# Patient Record
Sex: Male | Born: 1975 | Race: Black or African American | Hispanic: No | Marital: Married | Smoking: Never smoker
Health system: Southern US, Community
[De-identification: ages and names within clinical notes are randomized; demographics above are authoritative.]

## PROBLEM LIST (undated history)

## (undated) DIAGNOSIS — M1990 Unspecified osteoarthritis, unspecified site: Secondary | ICD-10-CM

## (undated) DIAGNOSIS — S82402A Unspecified fracture of shaft of left fibula, initial encounter for closed fracture: Secondary | ICD-10-CM

## (undated) DIAGNOSIS — S82862A Displaced Maisonneuve's fracture of left leg, initial encounter for closed fracture: Secondary | ICD-10-CM

## (undated) HISTORY — DX: Unspecified fracture of shaft of left fibula, initial encounter for closed fracture: S82.402A

## (undated) HISTORY — PX: NO PAST SURGERIES: SHX2092

---
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
## 2003-04-15 ENCOUNTER — Emergency Department (HOSPITAL_COMMUNITY): Admission: EM | Admit: 2003-04-15 | Discharge: 2003-04-15 | Payer: Self-pay

## 2004-07-31 ENCOUNTER — Ambulatory Visit: Payer: Self-pay | Admitting: Family Medicine

## 2013-03-10 ENCOUNTER — Ambulatory Visit
Admission: RE | Admit: 2013-03-10 | Discharge: 2013-03-10 | Disposition: A | Discharge status: Home / Self Care | Payer: BC Managed Care – PPO | Source: Ambulatory Visit | Attending: Family Medicine | Admitting: Family Medicine

## 2013-03-10 ENCOUNTER — Ambulatory Visit (INDEPENDENT_AMBULATORY_CARE_PROVIDER_SITE_OTHER): Payer: BC Managed Care – PPO | Admitting: Family Medicine

## 2013-03-10 ENCOUNTER — Encounter: Payer: Self-pay | Admitting: Family Medicine

## 2013-03-10 VITALS — BP 110/80 | HR 60 | Temp 98.1°F | Resp 12 | Ht 72.0 in | Wt 201.0 lb

## 2013-03-10 DIAGNOSIS — M545 Low back pain, unspecified: Secondary | ICD-10-CM

## 2013-03-10 MED ORDER — MELOXICAM 15 MG PO TABS: 15.0000 mg | ORAL_TABLET | Freq: Every day | ORAL | Status: DC

## 2013-03-10 NOTE — Progress Notes (Signed)
  Subjective:    Patient ID: Trevor Chavez, male    DOB: 1976-01-09, 37 y.o.   MRN: [id number]  HPI Patient is a very healthy 37 year old African American male who comes in today complaining of back pain. 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  He reports pain and stiffness in the bilateral paraspinal muscles most afternoons. He is extremely stiff in the morning when he wakes up. If he moves around the flexibility improve the pain in it resolves. However by the end of the day the pain returns and is worse. Sometimes at night he will also get some sciatica-like pain radiating down his left leg. He denies any symptoms of cauda equina syndrome. He denies any severe radicular pain. No past medical history on file. No current outpatient prescriptions on file prior to visit.   No current facility-administered medications on file prior to visit.   No Known Allergies History   Social History  . Marital Status: Married    Spouse Name: N/A    Number of Children: N/A  . Years of Education: N/A   Occupational History  . Not on file.   Social History Main Topics  . Smoking status: Never Smoker   . Smokeless tobacco: Not on file  . Alcohol Use: Yes     Comment: Occasional  . Drug Use: No  . Sexual Activity: Not on file   Other Topics Concern  . Not on file   Social History Narrative  . No narrative on file      Review of Systems  All other systems reviewed and are negative.       Objective:   Physical Exam  Vitals reviewed. Constitutional: He is oriented to person, place, and time.  Cardiovascular: Normal rate and regular rhythm.   Pulmonary/Chest: Effort normal and breath sounds normal.  Musculoskeletal:       Lumbar back: He exhibits decreased range of motion, tenderness and spasm. He exhibits no bony tenderness, no swelling, no edema and no deformity.  Neurological: He is alert and oriented to  person, place, and time. He has normal reflexes. He displays normal reflexes. No cranial nerve deficit. He exhibits normal muscle tone. Coordination normal.          Assessment & Plan:  1. Low back pain I believe the patient likely has low back pain due to her chronic myofascial strain. Recommended a lumbar spine x-ray to rule out bony pathology. Recommended that he went back brace at work. Also gave the patient a handout on home physical therapy stretches and exercises to perform for chronic low back pain. Recommended he take meloxicam 15 mg every day to each inflammation in his back. I did discuss the GI side effects this medication and recommended he periodically take breaks to prevent ulcer formation. - meloxicam (MOBIC) 15 MG tablet; Take 1 tablet (15 mg total) by mouth daily.  Dispense: 30 tablet; Refill: 2 - DG Lumbar Spine Complete; Future

## 2013-03-16 ENCOUNTER — Telehealth: Payer: Self-pay | Admitting: Family Medicine

## 2013-03-16 NOTE — Telephone Encounter (Signed)
Wants to know X-ray results. Call [phone]

## 2013-12-04 ENCOUNTER — Ambulatory Visit: Payer: BC Managed Care – PPO | Admitting: Family Medicine

## 2013-12-20 ENCOUNTER — Other Ambulatory Visit: Payer: BC Managed Care – PPO

## 2013-12-20 DIAGNOSIS — Z0000 Encounter for general adult medical examination without abnormal findings: Secondary | ICD-10-CM

## 2013-12-20 LAB — COMPREHENSIVE METABOLIC PANEL
ALT: 27 U/L (ref 0–53)
AST: 32 U/L (ref 0–37)
Albumin: 4.1 g/dL (ref 3.5–5.2)
Alkaline Phosphatase: 48 U/L (ref 39–117)
BILIRUBIN TOTAL: 0.9 mg/dL (ref 0.2–1.2)
BUN: 12 mg/dL (ref 6–23)
CALCIUM: 9 mg/dL (ref 8.4–10.5)
CHLORIDE: 104 meq/L (ref 96–112)
CO2: 26 meq/L (ref 19–32)
Creat: 1.26 mg/dL (ref 0.50–1.35)
GLUCOSE: 87 mg/dL (ref 70–99)
Potassium: 4 mEq/L (ref 3.5–5.3)
Sodium: 139 mEq/L (ref 135–145)
Total Protein: 6.5 g/dL (ref 6.0–8.3)

## 2013-12-20 LAB — LIPID PANEL
CHOLESTEROL: 173 mg/dL (ref 0–200)
HDL: 65 mg/dL (ref >39)
LDL Cholesterol: 86 mg/dL (ref 0–99)
Total CHOL/HDL Ratio: 2.7 Ratio
Triglycerides: 111 mg/dL (ref <150)
VLDL: 22 mg/dL (ref 0–40)

## 2013-12-29 ENCOUNTER — Encounter: Payer: BC Managed Care – PPO | Admitting: Family Medicine

## 2014-01-08 ENCOUNTER — Ambulatory Visit (INDEPENDENT_AMBULATORY_CARE_PROVIDER_SITE_OTHER): Payer: BC Managed Care – PPO | Admitting: Family Medicine

## 2014-01-08 ENCOUNTER — Encounter: Payer: Self-pay | Admitting: Family Medicine

## 2014-01-08 VITALS — BP 106/74 | HR 68 | Temp 97.5°F | Resp 14 | Ht 72.0 in | Wt 220.0 lb

## 2014-01-08 DIAGNOSIS — M5432 Sciatica, left side: Secondary | ICD-10-CM

## 2014-01-08 DIAGNOSIS — Z0000 Encounter for general adult medical examination without abnormal findings: Secondary | ICD-10-CM

## 2014-01-08 DIAGNOSIS — M5430 Sciatica, unspecified side: Secondary | ICD-10-CM

## 2014-01-08 MED ORDER — PREDNISONE 20 MG PO TABS: ORAL_TABLET | ORAL | Status: DC

## 2014-01-08 NOTE — Progress Notes (Signed)
Subjective:    Patient ID: Trevor Chavez, male    DOB: 1975-09-11, 38 y.o.   MRN: [id number]  HPI  Patient is here for CPE.  Overall he is doing well.  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  Ever since that time, he had a deep burning pain radiating from his lower back into his left gluteus.  He denies any weakness or numbness in his legs. The pain is worse with prolonged standing.  His most recent lab work is listed: Lab on 12/20/2013  Component Date Value Ref Range Status  . Sodium 12/20/2013 139  135 - 145 mEq/L Final  . Potassium 12/20/2013 4.0  3.5 - 5.3 mEq/L Final  . Chloride 12/20/2013 104  96 - 112 mEq/L Final  . CO2 12/20/2013 26  19 - 32 mEq/L Final  . Glucose, Bld 12/20/2013 87  70 - 99 mg/dL Final  . BUN 40/98/1191 12  6 - 23 mg/dL Final  . Creat 47/82/9562 1.26  0.50 - 1.35 mg/dL Final  . Total Bilirubin 12/20/2013 0.9  0.2 - 1.2 mg/dL Final  . Alkaline Phosphatase 12/20/2013 48  39 - 117 U/L Final  . AST 12/20/2013 32  0 - 37 U/L Final  . ALT 12/20/2013 27  0 - 53 U/L Final  . Total Protein 12/20/2013 6.5  6.0 - 8.3 g/dL Final  . Albumin 13/01/6577 4.1  3.5 - 5.2 g/dL Final  . Calcium 46/96/2952 9.0  8.4 - 10.5 mg/dL Final  . Cholesterol 84/13/2440 173  0 - 200 mg/dL Final   Comment: ATP III Classification:                                < 200        mg/dL        Desirable                               200 - 239     mg/dL        Borderline High                               >= 240        mg/dL        High                             . Triglycerides 12/20/2013 111  <150 mg/dL Final  . HDL 04/04/2535 65  >39 mg/dL Final  . Total CHOL/HDL Ratio 12/20/2013 2.7   Final  . VLDL 12/20/2013 22  0 - 40 mg/dL Final  . LDL Cholesterol 12/20/2013 86  0 - 99 mg/dL Final   Comment:                            Total Cholesterol/HDL Ratio:CHD Risk                                                 Coronary Heart Disease Risk Table  Men       Women                                   1/2 Average Risk              3.4        3.3                                       Average Risk              5.0        4.4                                    2X Average Risk              9.6        7.1                                    3X Average Risk             23.4       11.0                          Use the calculated Patient Ratio above and the CHD Risk table                           to determine the patient's CHD Risk.                          ATP III Classification (LDL):                                < 100        mg/dL         Optimal                               100 - 129     mg/dL         Near or Above Optimal                               130 - 159     mg/dL         Borderline High                               160 - 189     mg/dL         High                                > 190        mg/dL         Very High  No past medical history on file. No past surgical history on file. No current outpatient prescriptions on file prior to visit.   No current facility-administered medications on file prior to visit.   No Known Allergies History   Social History  . Marital Status: Married    Spouse Name: N/A    Number of Children: N/A  . Years of Education: N/A   Occupational History  . Not on file.   Social History Main Topics  . Smoking status: Never Smoker   . Smokeless tobacco: Not on file  . Alcohol Use: Yes     Comment: Occasional  . Drug Use: No  . Sexual Activity: Not on file   Other Topics Concern  . Not on file   Social History Narrative  . No narrative on file   Family History  Problem Relation Age of Onset  . Cancer Mother     lung  . Hypertension Father   . Mental illness Brother      Review of Systems  All other systems reviewed and are negative.      Objective:   Physical Exam  Vitals reviewed. Constitutional: He is  oriented to person, place, and time. He appears well-developed and well-nourished. No distress.  HENT:  Head: Normocephalic and atraumatic.  Right Ear: External ear normal.  Left Ear: External ear normal.  Nose: Nose normal.  Mouth/Throat: Oropharynx is clear and moist. No oropharyngeal exudate.  Eyes: Conjunctivae and EOM are normal. Pupils are equal, round, and reactive to light. Right eye exhibits no discharge. Left eye exhibits no discharge. No scleral icterus.  Neck: Normal range of motion. Neck supple. No JVD present. No tracheal deviation present. No thyromegaly present.  Cardiovascular: Normal rate, regular rhythm, normal heart sounds and intact distal pulses.  Exam reveals no gallop and no friction rub.   No murmur heard. Pulmonary/Chest: Effort normal and breath sounds normal. No stridor. No respiratory distress. He has no wheezes. He has no rales. He exhibits no tenderness.  Abdominal: Soft. Bowel sounds are normal. He exhibits no distension and no mass. There is no tenderness. There is no rebound and no guarding.  Genitourinary: Penis normal.  Musculoskeletal: Normal range of motion. He exhibits no edema and no tenderness.  Lymphadenopathy:    He has no cervical adenopathy.  Neurological: He is alert and oriented to person, place, and time. He has normal reflexes. He displays normal reflexes. No cranial nerve deficit. He exhibits normal muscle tone. Coordination normal.  Skin: Skin is warm. No rash noted. He is not diaphoretic. No erythema. No pallor.  Psychiatric: He has a normal mood and affect. His behavior is normal. Judgment and thought content normal.          Assessment & Plan:  1. Sciatica associated with disorder of lumbar spine, left Symptoms are consistent with sciatica. I give the patient prednisone taper pack. If symptoms persist greater than another 3 or 4 weeks I recommend an MRI - predniSONE (DELTASONE) 20 MG tablet; 3 tabs poqday 1-2, 2 tabs poqday 3-4, 1 tab  poqday 5-6  Dispense: 12 tablet; Refill: 0  2. Routine general medical examination at a health care facility Physical exam is completely normal. Lab work is excellent. Regular anticipatory guidance is provided. Follow up in one month of back pain is no better.

## 2015-03-21 ENCOUNTER — Encounter: Payer: Self-pay | Admitting: Family Medicine

## 2015-03-21 ENCOUNTER — Ambulatory Visit (INDEPENDENT_AMBULATORY_CARE_PROVIDER_SITE_OTHER): Payer: BLUE CROSS/BLUE SHIELD | Admitting: Family Medicine

## 2015-03-21 VITALS — BP 122/62 | HR 72 | Temp 98.2°F | Resp 14 | Ht 72.0 in | Wt 218.0 lb

## 2015-03-21 DIAGNOSIS — Z0000 Encounter for general adult medical examination without abnormal findings: Secondary | ICD-10-CM | POA: Diagnosis not present

## 2015-03-21 LAB — COMPLETE METABOLIC PANEL WITH GFR
ALT: 22 U/L (ref 9–46)
AST: 29 U/L (ref 10–40)
Albumin: 4 g/dL (ref 3.6–5.1)
Alkaline Phosphatase: 49 U/L (ref 40–115)
BUN: 11 mg/dL (ref 7–25)
CHLORIDE: 103 mmol/L (ref 98–110)
CO2: 28 mmol/L (ref 20–31)
Calcium: 9.1 mg/dL (ref 8.6–10.3)
Creat: 1.18 mg/dL (ref 0.60–1.35)
GFR, EST NON AFRICAN AMERICAN: 77 mL/min (ref >=60)
GFR, Est African American: 89 mL/min (ref >=60)
GLUCOSE: 81 mg/dL (ref 70–99)
POTASSIUM: 4 mmol/L (ref 3.5–5.3)
SODIUM: 139 mmol/L (ref 135–146)
Total Bilirubin: 0.6 mg/dL (ref 0.2–1.2)
Total Protein: 6.5 g/dL (ref 6.1–8.1)

## 2015-03-21 LAB — CBC WITH DIFFERENTIAL/PLATELET
Basophils Absolute: 0.1 10*3/uL (ref 0.0–0.1)
Basophils Relative: 1 % (ref 0–1)
Eosinophils Absolute: 0.2 10*3/uL (ref 0.0–0.7)
Eosinophils Relative: 4 % (ref 0–5)
HEMATOCRIT: 45.3 % (ref 39.0–52.0)
HEMOGLOBIN: 15.4 g/dL (ref 13.0–17.0)
LYMPHS PCT: 39 % (ref 12–46)
Lymphs Abs: 2 10*3/uL (ref 0.7–4.0)
MCH: 30 pg (ref 26.0–34.0)
MCHC: 34 g/dL (ref 30.0–36.0)
MCV: 88.1 fL (ref 78.0–100.0)
MONO ABS: 0.5 10*3/uL (ref 0.1–1.0)
MONOS PCT: 10 % (ref 3–12)
MPV: 9.9 fL (ref 8.6–12.4)
NEUTROS PCT: 46 % (ref 43–77)
Neutro Abs: 2.3 10*3/uL (ref 1.7–7.7)
Platelets: 238 10*3/uL (ref 150–400)
RBC: 5.14 MIL/uL (ref 4.22–5.81)
RDW: 13 % (ref 11.5–15.5)
WBC: 5 10*3/uL (ref 4.0–10.5)

## 2015-03-21 LAB — LIPID PANEL
CHOL/HDL RATIO: 2.8 ratio (ref <=5.0)
Cholesterol: 152 mg/dL (ref 125–200)
HDL: 54 mg/dL (ref >=40)
LDL CALC: 78 mg/dL (ref <130)
Triglycerides: 100 mg/dL (ref <150)
VLDL: 20 mg/dL (ref <30)

## 2015-03-21 NOTE — Progress Notes (Signed)
Subjective:    Patient ID: Trevor Chavez, male    DOB: 03/18/1976, 39 y.o.   MRN: [id number]  HPI Patient is a very pleasant 39 year old African-American male here today for complete physical exam. He is extremely healthy. He does not smoke. He does not drink. He has no major health problems. He does not take any medication on a regular basis. He is due today for a flu shot as well as a tetanus shot. We discussed both of these and he politely declined both. Recently his business closed and he is currently looking for work. He works in Landscape architectindustrial maintenance. Otherwise he is been doing well. He has 2 children, a daughter who is a teenager and a son who is 40 years old. His son is a Print production plannerbaseball player.  He denies any family history of colon cancer, prostate cancer, or testicular cancer. He is married to [place]. No past medical history on file. No past surgical history on file. No current outpatient prescriptions on file prior to visit.   No current facility-administered medications on file prior to visit.   No Known Allergies Social History   Social History  . Marital Status: Married    Spouse Name: N/A  . Number of Children: N/A  . Years of Education: N/A   Occupational History  . Not on file.   Social History Main Topics  . Smoking status: Never Smoker   . Smokeless tobacco: Never Used  . Alcohol Use: 0.0 oz/week    0 Standard drinks or equivalent per week     Comment: Occasional  . Drug Use: No  . Sexual Activity: Yes   Other Topics Concern  . Not on file   Social History Narrative   Family History  Problem Relation Age of Onset  . Cancer Mother     lung  . Hypertension Father   . Mental illness Brother       Review of Systems  All other systems reviewed and are negative.      Objective:   Physical Exam  Constitutional: He is oriented to person, place, and time. He appears well-developed and well-nourished. No distress.  HENT:  Head: Normocephalic and  atraumatic.  Right Ear: External ear normal.  Left Ear: External ear normal.  Nose: Nose normal.  Mouth/Throat: Oropharynx is clear and moist. No oropharyngeal exudate.  Eyes: Conjunctivae and EOM are normal. Pupils are equal, round, and reactive to light. Right eye exhibits no discharge. Left eye exhibits no discharge. No scleral icterus.  Neck: Normal range of motion. Neck supple. No JVD present. No tracheal deviation present. No thyromegaly present.  Cardiovascular: Normal rate, regular rhythm, normal heart sounds and intact distal pulses.  Exam reveals no gallop and no friction rub.   No murmur heard. Pulmonary/Chest: Effort normal and breath sounds normal. No stridor. No respiratory distress. He has no wheezes. He has no rales. He exhibits no tenderness.  Abdominal: Soft. Bowel sounds are normal. He exhibits no distension and no mass. There is no tenderness. There is no rebound and no guarding. Hernia confirmed negative in the right inguinal area and confirmed negative in the left inguinal area.  Genitourinary: Testes normal and penis normal. Right testis shows no mass. Left testis shows no mass.  Musculoskeletal: Normal range of motion. He exhibits no edema.  Lymphadenopathy:    He has no cervical adenopathy.       Right: No inguinal adenopathy present.       Left: No inguinal adenopathy present.  Neurological: He is alert and oriented to person, place, and time. He has normal reflexes. He displays normal reflexes. No cranial nerve deficit. He exhibits normal muscle tone. Coordination normal.  Skin: Skin is warm. No rash noted. He is not diaphoretic. No erythema. No pallor.  Psychiatric: He has a normal mood and affect. His behavior is normal. Judgment and thought content normal.  Vitals reviewed.         Assessment & Plan:  Routine general medical examination at a health care facility - Plan: CBC with Differential/Platelet, COMPLETE METABOLIC PANEL WITH GFR, Lipid  panel  Patient's physical exam is completely normal. He declines his immunizations today. I will check a CBC, CMP, fasting lipid panel.  Otherwise regular anticipatory guidance is provided. Blood pressure is excellent. Recheck in one year or as needed.

## 2015-03-22 ENCOUNTER — Encounter: Payer: Self-pay | Admitting: Family Medicine

## 2015-06-09 HISTORY — PX: VASECTOMY: SHX75

## 2017-06-25 ENCOUNTER — Ambulatory Visit: Payer: BLUE CROSS/BLUE SHIELD | Admitting: Family Medicine

## 2017-06-25 ENCOUNTER — Ambulatory Visit
Admission: RE | Admit: 2017-06-25 | Discharge: 2017-06-25 | Disposition: A | Discharge status: Home / Self Care | Payer: Self-pay | Source: Ambulatory Visit | Attending: Family Medicine | Admitting: Family Medicine

## 2017-06-25 ENCOUNTER — Encounter: Payer: Self-pay | Admitting: Family Medicine

## 2017-06-25 VITALS — BP 110/68 | HR 64 | Temp 98.3°F | Resp 14 | Ht 72.0 in | Wt 213.0 lb

## 2017-06-25 DIAGNOSIS — M79641 Pain in right hand: Secondary | ICD-10-CM

## 2017-06-25 DIAGNOSIS — S66811A Strain of other specified muscles, fascia and tendons at wrist and hand level, right hand, initial encounter: Secondary | ICD-10-CM

## 2017-06-25 MED ORDER — DICLOFENAC SODIUM 75 MG PO TBEC: 75.0000 mg | DELAYED_RELEASE_TABLET | Freq: Two times a day (BID) | ORAL | 0 refills | Status: DC

## 2017-06-25 NOTE — Progress Notes (Signed)
   Subjective:    Patient ID: Trevor Chavez, male    DOB: 08/11/1975, 42 y.o.   MRN: [id number]  HPI Patient is a very pleasant 42 year old African-American male who presents with 2 weeks of pain in his right first MCP joint. There is also some mild swelling around the MCP joint. He has pain and stiffness with thumb apposition when he tries to touch the hypothenar eminence.  He has tenderness to palpation around the MCP joint. He also has tenderness on the dorsal surface of the MCP joint with Finkelstein's maneuver. There is no pain with thumb abduction however. He has normal range of motion in all his other PIP and DIP joints. There are no palpable nodules or erythema or warmth in any of his joints. He denies any injury. No past medical history on file.  No current outpatient medications on file prior to visit.   No current facility-administered medications on file prior to visit.    No Known Allergies Social History   Socioeconomic History  . Marital status: Married    Spouse name: Not on file  . Number of children: Not on file  . Years of education: Not on file  . Highest education level: Not on file  Social Needs  . Financial resource strain: Not on file  . Food insecurity - worry: Not on file  . Food insecurity - inability: Not on file  . Transportation needs - medical: Not on file  . Transportation needs - non-medical: Not on file  Occupational History  . Not on file  Tobacco Use  . Smoking status: Never Smoker  . Smokeless tobacco: Never Used  Substance and Sexual Activity  . Alcohol use: Yes    Alcohol/week: 0.0 oz    Comment: Occasional  . Drug use: No  . Sexual activity: Yes  Other Topics Concern  . Not on file  Social History Narrative  . Not on file      Review of Systems  All other systems reviewed and are negative.      Objective:   Physical Exam  Constitutional: He appears well-developed and well-nourished.  Cardiovascular: Normal rate, regular  rhythm and normal heart sounds.  Pulmonary/Chest: Effort normal and breath sounds normal.  Musculoskeletal:       Right hand: He exhibits decreased range of motion, tenderness, bony tenderness and swelling. He exhibits normal capillary refill and no deformity. Normal sensation noted. Decreased sensation is not present in the medial distribution. Decreased strength noted. He exhibits thumb/finger opposition.       Hands: Nursing note and vitals reviewed.         Assessment & Plan:  Right hand pain - Plan: DG Hand Complete Right  Strain of metacarpophalangeal joint of right hand, initial encounter  I believe the patient has either strain the first MCP joint or has early arthritis in the first MCP joint due to his occupation. I recommended wearing a thumb spica splint for the next 2 weeks to help rest the joint. Also recommended diclofenac 75 mg by mouth twice a day. Ice as needed. Obtain x-ray of the right hand to evaluate further.

## 2018-07-11 ENCOUNTER — Ambulatory Visit (INDEPENDENT_AMBULATORY_CARE_PROVIDER_SITE_OTHER): Payer: BLUE CROSS/BLUE SHIELD | Admitting: Family Medicine

## 2018-07-11 ENCOUNTER — Encounter: Payer: Self-pay | Admitting: Family Medicine

## 2018-07-11 VITALS — BP 126/80 | HR 64 | Temp 98.0°F | Resp 16 | Ht 72.0 in | Wt 225.0 lb

## 2018-07-11 DIAGNOSIS — R195 Other fecal abnormalities: Secondary | ICD-10-CM

## 2018-07-11 DIAGNOSIS — Z23 Encounter for immunization: Secondary | ICD-10-CM

## 2018-07-11 DIAGNOSIS — Z0000 Encounter for general adult medical examination without abnormal findings: Secondary | ICD-10-CM | POA: Diagnosis not present

## 2018-07-11 NOTE — Addendum Note (Signed)
Addended by: Legrand Rams B on: 07/11/2018 02:29 PM   Modules accepted: Orders

## 2018-07-11 NOTE — Progress Notes (Signed)
Subjective:    Patient ID: Trevor Chavez, male    DOB: 02/25/1976, [age] y.o.   MRN: [id number]  HPI Patient is a very pleasant 43 year old African-American male here today for complete physical exam. He is extremely healthy. He does not smoke. He does not drink. He has no major health problems. He does not take any medication on a regular basis.  He denies any family history of colon cancer, prostate cancer, or testicular cancer. He is married to [name].  Patient states that over the last 6 to 8 months, he has noticed that once or twice a week his stool will be flat when it comes out.  The other times he has a bowel movement it will be completely normal.  He denies any melena or hematochezia.  He denies any abdominal pain although he does occasionally get cramps if he eats a lot of fruit.  He did have a diagnosis of IBS when he was younger.  He also states that he suffers from occasional constipation.  He has no significant abdominal pain or perirectal pain with defecation No past medical history on file.  Current Outpatient Medications on File Prior to Visit  Medication Sig Dispense Refill  . diclofenac (VOLTAREN) 75 MG EC tablet Take 1 tablet (75 mg total) by mouth 2 (two) times daily. 30 tablet 0   No current facility-administered medications on file prior to visit.    No Known Allergies Social History   Socioeconomic History  . Marital status: Married    Spouse name: Not on file  . Number of children: Not on file  . Years of education: Not on file  . Highest education level: Not on file  Occupational History  . Not on file  Social Needs  . Financial resource strain: Not on file  . Food insecurity:    Worry: Not on file    Inability: Not on file  . Transportation needs:    Medical: Not on file    Non-medical: Not on file  Tobacco Use  . Smoking status: Never Smoker  . Smokeless tobacco: Never Used  Substance and Sexual Activity  . Alcohol use: Yes    Alcohol/week: 0.0  standard drinks    Comment: Occasional  . Drug use: No  . Sexual activity: Yes  Lifestyle  . Physical activity:    Days per week: Not on file    Minutes per session: Not on file  . Stress: Not on file  Relationships  . Social connections:    Talks on phone: Not on file    Gets together: Not on file    Attends religious service: Not on file    Active member of club or organization: Not on file    Attends meetings of clubs or organizations: Not on file    Relationship status: Not on file  . Intimate partner violence:    Fear of current or ex partner: Not on file    Emotionally abused: Not on file    Physically abused: Not on file    Forced sexual activity: Not on file  Other Topics Concern  . Not on file  Social History Narrative  . Not on file   Family History  Problem Relation Age of Onset  . Cancer Mother        lung  . Hypertension Father   . Mental illness Brother       Review of Systems  All other systems reviewed and are negative.  Objective:   Physical Exam  Constitutional: He is oriented to person, place, and time. He appears well-developed and well-nourished. No distress.  HENT:  Head: Normocephalic and atraumatic.  Right Ear: External ear normal.  Left Ear: External ear normal.  Nose: Nose normal.  Mouth/Throat: Oropharynx is clear and moist. No oropharyngeal exudate.  Eyes: Pupils are equal, round, and reactive to light. Conjunctivae and EOM are normal. Right eye exhibits no discharge. Left eye exhibits no discharge. No scleral icterus.  Neck: Normal range of motion. Neck supple. No JVD present. No tracheal deviation present. No thyromegaly present.  Cardiovascular: Normal rate, regular rhythm, normal heart sounds and intact distal pulses. Exam reveals no gallop and no friction rub.  No murmur heard. Pulmonary/Chest: Effort normal and breath sounds normal. No stridor. No respiratory distress. He has no wheezes. He has no rales. He exhibits no  tenderness.  Abdominal: Soft. Bowel sounds are normal. He exhibits no distension and no mass. There is no abdominal tenderness. There is no rebound and no guarding. Hernia confirmed negative in the right inguinal area and confirmed negative in the left inguinal area.  Genitourinary:    Prostate, testes, penis and rectum normal.  Right testis shows no mass and no tenderness. Left testis shows no mass and no tenderness. Circumcised.  Musculoskeletal: Normal range of motion.        General: No edema.  Lymphadenopathy:    He has no cervical adenopathy.       Right: No inguinal adenopathy present.       Left: No inguinal adenopathy present.  Neurological: He is alert and oriented to person, place, and time. He has normal reflexes. No cranial nerve deficit. He exhibits normal muscle tone. Coordination normal.  Skin: Skin is warm. No rash noted. He is not diaphoretic. No erythema. No pallor.  Psychiatric: He has a normal mood and affect. His behavior is normal. Judgment and thought content normal.  Vitals reviewed.         Assessment & Plan:  General medical exam  Patient's physical exam is completely normal.  He received his tetanus shot today.  We will check a CBC, CMP, fasting lipid panel.  I will also check his stool for blood.  We discussed going through a diagnostic colonoscopy given the change in his stool caliber however he declines this at the present time and wants to check with his insurance to see the coverage on this before going for such.  His rectal exam is completely normal today.  There is no perirectal mass that would explain the change in stool caliber.  I believe is most likely due to his IBS however I will check a fecal occult blood.  If there is blood in his stool or if the patient symptoms worsen I would recommend a colonoscopy.  He refuses a flu shot today.  He agrees to the tetanus vaccine.

## 2018-07-12 LAB — CBC WITH DIFFERENTIAL/PLATELET
ABSOLUTE MONOCYTES: 547 {cells}/uL (ref 200–950)
BASOS PCT: 1 %
Basophils Absolute: 57 cells/uL (ref 0–200)
EOS ABS: 291 {cells}/uL (ref 15–500)
Eosinophils Relative: 5.1 %
HCT: 43.6 % (ref 38.5–50.0)
Hemoglobin: 15.4 g/dL (ref 13.2–17.1)
Lymphs Abs: 2138 cells/uL (ref 850–3900)
MCH: 31.3 pg (ref 27.0–33.0)
MCHC: 35.3 g/dL (ref 32.0–36.0)
MCV: 88.6 fL (ref 80.0–100.0)
MPV: 9.9 fL (ref 7.5–12.5)
Monocytes Relative: 9.6 %
Neutro Abs: 2668 cells/uL (ref 1500–7800)
Neutrophils Relative %: 46.8 %
Platelets: 291 10*3/uL (ref 140–400)
RBC: 4.92 10*6/uL (ref 4.20–5.80)
RDW: 12.2 % (ref 11.0–15.0)
TOTAL LYMPHOCYTE: 37.5 %
WBC: 5.7 10*3/uL (ref 3.8–10.8)

## 2018-07-12 LAB — COMPLETE METABOLIC PANEL WITH GFR
AG RATIO: 1.7 (calc) (ref 1.0–2.5)
ALBUMIN MSPROF: 4 g/dL (ref 3.6–5.1)
ALKALINE PHOSPHATASE (APISO): 59 U/L (ref 36–130)
ALT: 40 U/L (ref 9–46)
AST: 64 U/L — ABNORMAL HIGH (ref 10–40)
BILIRUBIN TOTAL: 0.4 mg/dL (ref 0.2–1.2)
BUN: 14 mg/dL (ref 7–25)
CHLORIDE: 105 mmol/L (ref 98–110)
CO2: 26 mmol/L (ref 20–32)
Calcium: 8.9 mg/dL (ref 8.6–10.3)
Creat: 1.21 mg/dL (ref 0.60–1.35)
GFR, Est African American: 84 mL/min/{1.73_m2} (ref >=60)
GFR, Est Non African American: 73 mL/min/{1.73_m2} (ref >=60)
GLUCOSE: 96 mg/dL (ref 65–99)
Globulin: 2.4 g/dL (calc) (ref 1.9–3.7)
POTASSIUM: 4.1 mmol/L (ref 3.5–5.3)
SODIUM: 140 mmol/L (ref 135–146)
Total Protein: 6.4 g/dL (ref 6.1–8.1)

## 2018-07-12 LAB — LIPID PANEL
CHOLESTEROL: 174 mg/dL (ref <200)
HDL: 53 mg/dL (ref >=40)
LDL Cholesterol (Calc): 101 mg/dL (calc) — ABNORMAL HIGH
Non-HDL Cholesterol (Calc): 121 mg/dL (calc) (ref <130)
Total CHOL/HDL Ratio: 3.3 (calc) (ref <5.0)
Triglycerides: 106 mg/dL (ref <150)

## 2018-07-13 ENCOUNTER — Other Ambulatory Visit: Payer: BLUE CROSS/BLUE SHIELD

## 2018-07-14 LAB — FECAL GLOBIN BY IMMUNOCHEMISTRY
FECAL GLOBIN RESULT: NOT DETECTED
MICRO NUMBER:: 154563
SPECIMEN QUALITY:: ADEQUATE

## 2018-07-29 ENCOUNTER — Encounter: Payer: Self-pay | Admitting: Family Medicine

## 2018-07-29 ENCOUNTER — Other Ambulatory Visit: Payer: Self-pay | Admitting: Family Medicine

## 2018-07-29 DIAGNOSIS — R7989 Other specified abnormal findings of blood chemistry: Secondary | ICD-10-CM

## 2018-07-29 DIAGNOSIS — R945 Abnormal results of liver function studies: Principal | ICD-10-CM

## 2018-09-05 ENCOUNTER — Encounter: Payer: Self-pay | Admitting: Family Medicine

## 2018-09-05 ENCOUNTER — Telehealth: Payer: Self-pay | Admitting: Family Medicine

## 2018-09-05 NOTE — Telephone Encounter (Signed)
Pt called and states that he was out of work today d/t his sinuses being bad and his work is saying he needs a note stating that he does not have covid-19. He needs today if possible so he can return to work tomorrow. OK to write?

## 2018-09-05 NOTE — Telephone Encounter (Signed)
Ok to write? 

## 2018-09-05 NOTE — Telephone Encounter (Signed)
Letter done and pt aware to pick up

## 2018-10-28 ENCOUNTER — Encounter: Payer: Self-pay | Admitting: Family Medicine

## 2018-10-28 ENCOUNTER — Ambulatory Visit (INDEPENDENT_AMBULATORY_CARE_PROVIDER_SITE_OTHER): Payer: BLUE CROSS/BLUE SHIELD | Admitting: Family Medicine

## 2018-10-28 ENCOUNTER — Other Ambulatory Visit: Payer: Self-pay

## 2018-10-28 VITALS — BP 128/64 | HR 58 | Temp 97.9°F | Resp 12 | Ht 72.0 in | Wt 224.0 lb

## 2018-10-28 DIAGNOSIS — S83281A Other tear of lateral meniscus, current injury, right knee, initial encounter: Secondary | ICD-10-CM | POA: Diagnosis not present

## 2018-10-28 MED ORDER — DICLOFENAC SODIUM 75 MG PO TBEC: 75.0000 mg | DELAYED_RELEASE_TABLET | Freq: Two times a day (BID) | ORAL | 1 refills | Status: DC

## 2018-10-28 NOTE — Progress Notes (Signed)
Subjective:    Patient ID: Trevor Chavez, male    DOB: 02/14/1976, 43 y.o.   MRN: [id number]  HPI Patient is a very pleasant 43 year old African-American male who presents with a two-week history of posterior lateral right knee pain.  He states that his knees been giving him a little bit of pain on the lateral aspect for the last month or so.  However the pain has been very mild and nonspecific.  It usually occurs whenever he would squat.  However 1 week ago, the patient slipped at work.  His left leg slipped out from underneath him and he suffered a twisting flexion injury to his right knee.  He did not hit the ground however ever since he has had posterior lateral sharp knee pain with flexion.  It hurts to walk.  He also reports some tightness and occasional swelling.  Today on exam there is no effusion.  There is no erythema or warmth.  There is no laxity to varus or valgus stress.  He has a negative anterior and posterior drawer sign however he has a positive Apley grind with sharp pain in the posterior lateral joint space No past medical history on file.  No current outpatient medications on file prior to visit.   No current facility-administered medications on file prior to visit.    No Known Allergies Social History   Socioeconomic History  . Marital status: Married    Spouse name: Not on file  . Number of children: Not on file  . Years of education: Not on file  . Highest education level: Not on file  Occupational History  . Not on file  Social Needs  . Financial resource strain: Not on file  . Food insecurity:    Worry: Not on file    Inability: Not on file  . Transportation needs:    Medical: Not on file    Non-medical: Not on file  Tobacco Use  . Smoking status: Never Smoker  . Smokeless tobacco: Never Used  Substance and Sexual Activity  . Alcohol use: Yes    Alcohol/week: 0.0 standard drinks    Comment: Occasional  . Drug use: No  . Sexual activity: Yes   Lifestyle  . Physical activity:    Days per week: Not on file    Minutes per session: Not on file  . Stress: Not on file  Relationships  . Social connections:    Talks on phone: Not on file    Gets together: Not on file    Attends religious service: Not on file    Active member of club or organization: Not on file    Attends meetings of clubs or organizations: Not on file    Relationship status: Not on file  . Intimate partner violence:    Fear of current or ex partner: Not on file    Emotionally abused: Not on file    Physically abused: Not on file    Forced sexual activity: Not on file  Other Topics Concern  . Not on file  Social History Narrative  . Not on file      Review of Systems  All other systems reviewed and are negative.      Objective:   Physical Exam  Constitutional: He appears well-developed and well-nourished.  Cardiovascular: Normal rate, regular rhythm and normal heart sounds.  Pulmonary/Chest: Effort normal and breath sounds normal.  Musculoskeletal:     Right knee: He exhibits abnormal meniscus. He exhibits normal  range of motion, no swelling, no effusion, no LCL laxity, no bony tenderness and no MCL laxity. No medial joint line, no lateral joint line, no MCL and no LCL tenderness noted.  Nursing note and vitals reviewed.         Assessment & Plan:  Acute lateral meniscus tear of right knee, initial encounter  I believe the patient has suffered a meniscal tear.  I have recommended conservative therapy.  We discussed cortisone injections versus anti-inflammatories and a knee brace.  The patient elects to wear a hinged knee brace daily and to take diclofenac 75 mg twice daily for the next 1 to 2 weeks to see if the symptoms will improve.  If worsening I would recommend a cortisone injection and an x-ray.  Ideally would like to get an MRI if symptoms are not improving as I suspect a meniscal tear and if the pain is worsening he may need orthopedic  consultation for arthroscopic surgery.

## 2018-11-03 ENCOUNTER — Other Ambulatory Visit: Payer: Self-pay | Admitting: Family Medicine

## 2018-11-03 ENCOUNTER — Other Ambulatory Visit: Payer: Self-pay

## 2018-11-03 ENCOUNTER — Ambulatory Visit: Payer: Self-pay

## 2018-11-03 DIAGNOSIS — M25561 Pain in right knee: Secondary | ICD-10-CM

## 2018-12-15 IMAGING — DX DG HAND COMPLETE 3+V*R*
3 series · 3 of 3 positions shown · non-contrast
Comparison: None.

CLINICAL DATA: Thumb pain first MCP joint.  No injury

EXAM:
RIGHT HAND - COMPLETE 3+ VIEW

[dg hand complete right (1 of 3)]
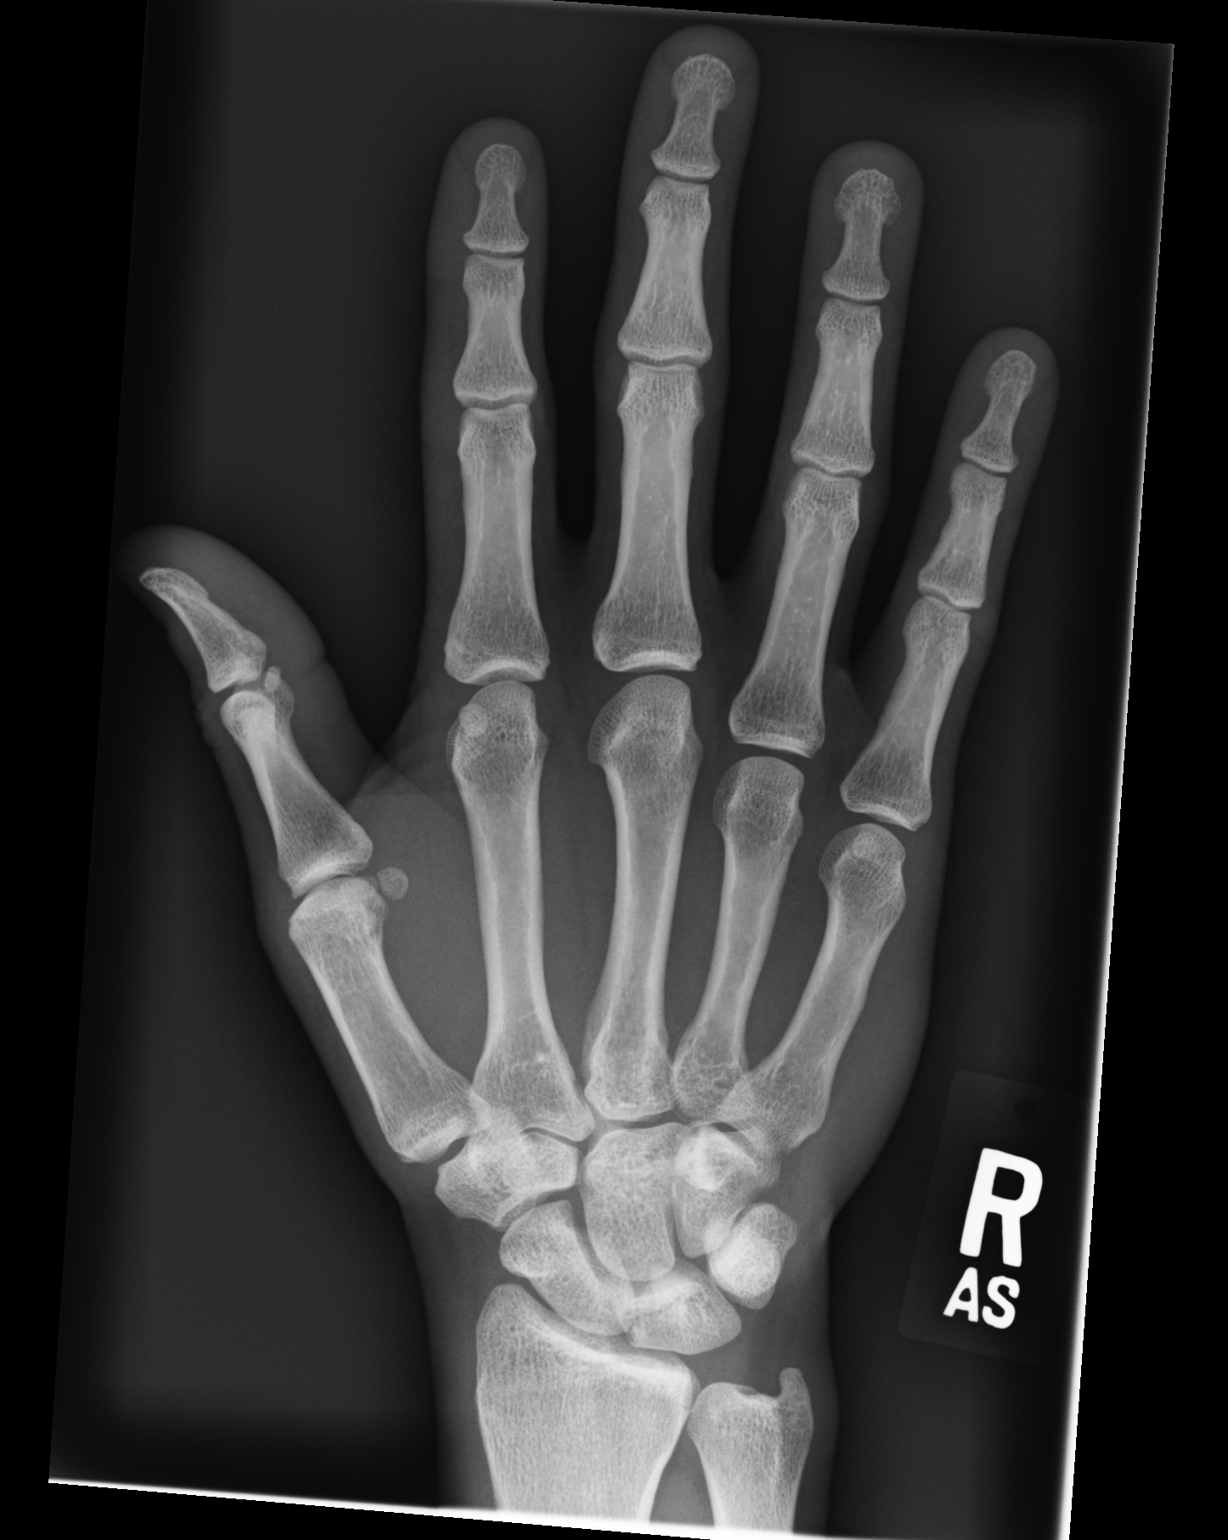

[dg hand complete right (2 of 3)]
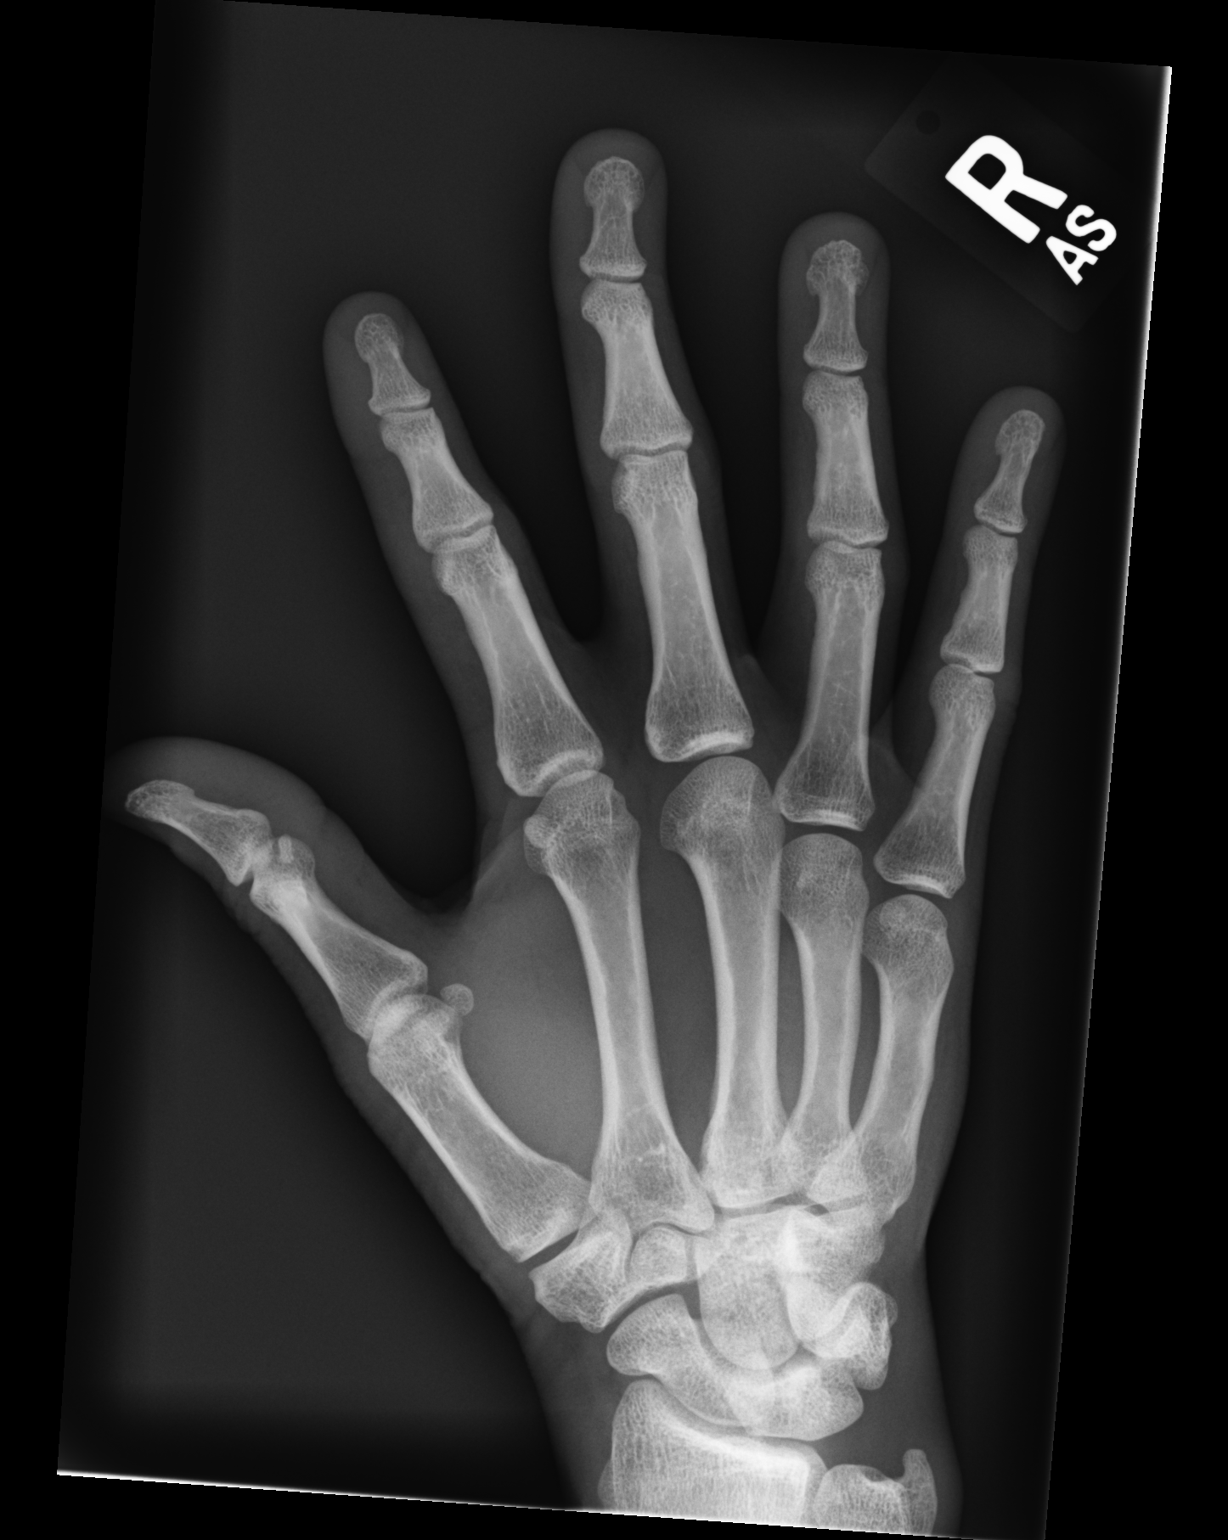

[dg hand complete right (3 of 3)]
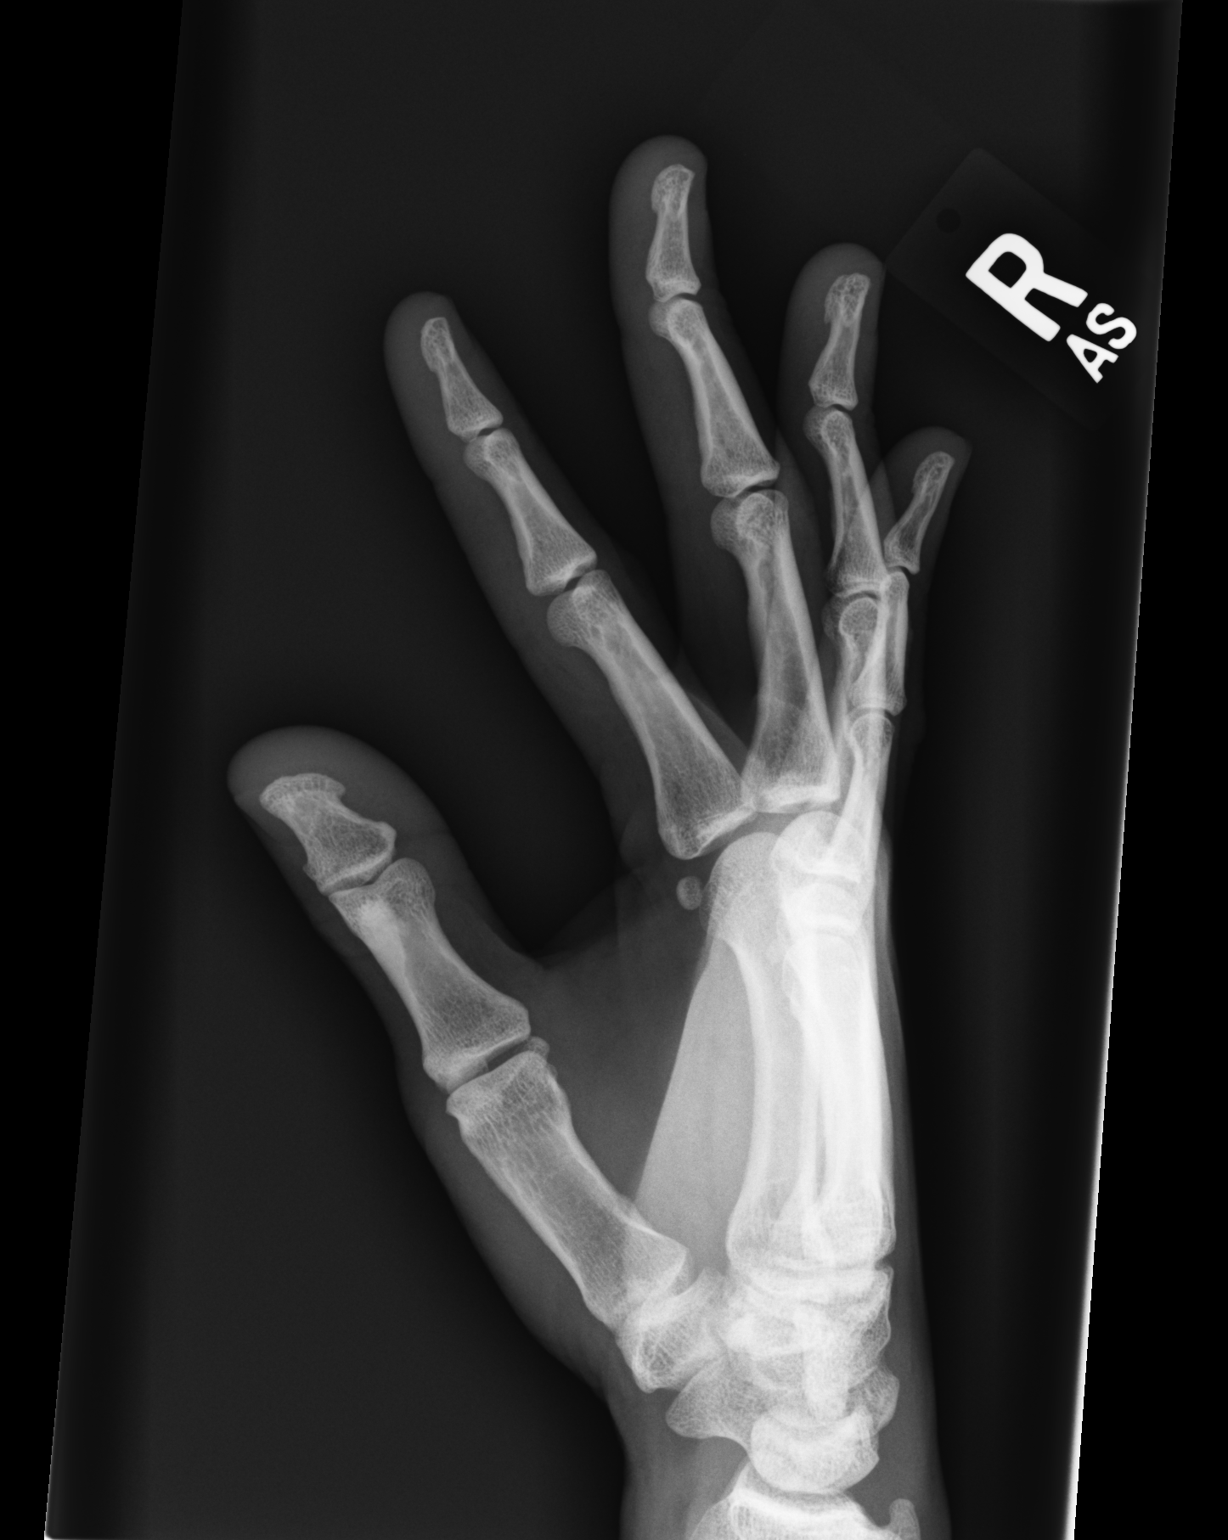

[3 of 3 positions shown; findings below may reference images not displayed]

FINDINGS: There is no evidence of fracture or dislocation. There is no
evidence of arthropathy or other focal bone abnormality. Soft
tissues are unremarkable.
IMPRESSION: Negative.

## 2019-01-13 ENCOUNTER — Encounter: Payer: Self-pay | Admitting: Family Medicine

## 2019-01-13 DIAGNOSIS — S82402A Unspecified fracture of shaft of left fibula, initial encounter for closed fracture: Secondary | ICD-10-CM | POA: Insufficient documentation

## 2019-01-16 ENCOUNTER — Other Ambulatory Visit: Payer: Self-pay | Admitting: Orthopedic Surgery

## 2019-01-16 ENCOUNTER — Encounter (HOSPITAL_BASED_OUTPATIENT_CLINIC_OR_DEPARTMENT_OTHER): Payer: Self-pay | Admitting: *Deleted

## 2019-01-16 ENCOUNTER — Other Ambulatory Visit: Payer: Self-pay

## 2019-01-16 ENCOUNTER — Other Ambulatory Visit (HOSPITAL_COMMUNITY)
Admission: RE | Admit: 2019-01-16 | Discharge: 2019-01-16 | Disposition: A | Discharge status: Home / Self Care | Payer: BLUE CROSS/BLUE SHIELD | Source: Ambulatory Visit | Attending: Orthopedic Surgery | Admitting: Orthopedic Surgery

## 2019-01-16 DIAGNOSIS — Z20828 Contact with and (suspected) exposure to other viral communicable diseases: Secondary | ICD-10-CM | POA: Diagnosis not present

## 2019-01-16 DIAGNOSIS — Z01812 Encounter for preprocedural laboratory examination: Secondary | ICD-10-CM | POA: Diagnosis not present

## 2019-01-16 LAB — SARS CORONAVIRUS 2 (TAT 6-24 HRS): SARS Coronavirus 2: NEGATIVE

## 2019-01-17 ENCOUNTER — Encounter (HOSPITAL_BASED_OUTPATIENT_CLINIC_OR_DEPARTMENT_OTHER)
Admission: RE | Disposition: A | Discharge status: Home / Self Care | Payer: Self-pay | Source: Ambulatory Visit | Attending: Orthopedic Surgery

## 2019-01-17 ENCOUNTER — Ambulatory Visit (HOSPITAL_BASED_OUTPATIENT_CLINIC_OR_DEPARTMENT_OTHER): Payer: BLUE CROSS/BLUE SHIELD | Admitting: Anesthesiology

## 2019-01-17 ENCOUNTER — Encounter (HOSPITAL_BASED_OUTPATIENT_CLINIC_OR_DEPARTMENT_OTHER): Payer: Self-pay | Admitting: Anesthesiology

## 2019-01-17 ENCOUNTER — Ambulatory Visit (HOSPITAL_BASED_OUTPATIENT_CLINIC_OR_DEPARTMENT_OTHER)
Admission: RE | Admit: 2019-01-17 | Discharge: 2019-01-17 | Disposition: A | Discharge status: Home / Self Care | Payer: BLUE CROSS/BLUE SHIELD | Source: Ambulatory Visit | Attending: Orthopedic Surgery | Admitting: Orthopedic Surgery

## 2019-01-17 DIAGNOSIS — S93432A Sprain of tibiofibular ligament of left ankle, initial encounter: Secondary | ICD-10-CM | POA: Diagnosis not present

## 2019-01-17 DIAGNOSIS — S82842A Displaced bimalleolar fracture of left lower leg, initial encounter for closed fracture: Secondary | ICD-10-CM | POA: Diagnosis not present

## 2019-01-17 DIAGNOSIS — S82862A Displaced Maisonneuve's fracture of left leg, initial encounter for closed fracture: Secondary | ICD-10-CM

## 2019-01-17 HISTORY — PX: ORIF ANKLE FRACTURE: SHX5408

## 2019-01-17 HISTORY — DX: Displaced Maisonneuve's fracture of left leg, initial encounter for closed fracture: S82.862A

## 2019-01-17 SURGERY — OPEN REDUCTION INTERNAL FIXATION (ORIF) ANKLE FRACTURE
Anesthesia: General | Site: Ankle | Laterality: Left

## 2019-01-17 MED ORDER — ENSURE PRE-SURGERY PO LIQD: 296.0000 mL | Freq: Once | ORAL | Status: DC

## 2019-01-17 MED ORDER — PHENYLEPHRINE 40 MCG/ML (10ML) SYRINGE FOR IV PUSH (FOR BLOOD PRESSURE SUPPORT)
PREFILLED_SYRINGE | INTRAVENOUS | Status: DC | PRN
  Administered 2019-01-17: 40 ug via INTRAVENOUS
  Administered 2019-01-17: 80 ug via INTRAVENOUS

## 2019-01-17 MED ORDER — SCOPOLAMINE 1 MG/3DAYS TD PT72: 1.0000 | MEDICATED_PATCH | Freq: Once | TRANSDERMAL | Status: DC

## 2019-01-17 MED ORDER — CEFAZOLIN SODIUM-DEXTROSE 2-4 GM/100ML-% IV SOLN
INTRAVENOUS | Status: AC
  Filled 2019-01-17: qty 100

## 2019-01-17 MED ORDER — CHLORHEXIDINE GLUCONATE 4 % EX LIQD: 60.0000 mL | Freq: Once | CUTANEOUS | Status: DC

## 2019-01-17 MED ORDER — MIDAZOLAM HCL 2 MG/2ML IJ SOLN
INTRAMUSCULAR | Status: AC
  Filled 2019-01-17: qty 2

## 2019-01-17 MED ORDER — SENNA-DOCUSATE SODIUM 8.6-50 MG PO TABS: 2.0000 | ORAL_TABLET | Freq: Every day | ORAL | 1 refills | Status: DC

## 2019-01-17 MED ORDER — FENTANYL CITRATE (PF) 100 MCG/2ML IJ SOLN
INTRAMUSCULAR | Status: AC
  Filled 2019-01-17: qty 2

## 2019-01-17 MED ORDER — CELECOXIB 200 MG PO CAPS
ORAL_CAPSULE | ORAL | Status: AC
  Filled 2019-01-17: qty 1

## 2019-01-17 MED ORDER — ONDANSETRON HCL 4 MG PO TABS: 4.0000 mg | ORAL_TABLET | Freq: Three times a day (TID) | ORAL | 0 refills | Status: DC | PRN

## 2019-01-17 MED ORDER — PROPOFOL 10 MG/ML IV BOLUS
INTRAVENOUS | Status: AC
  Filled 2019-01-17: qty 20

## 2019-01-17 MED ORDER — LIDOCAINE 2% (20 MG/ML) 5 ML SYRINGE
INTRAMUSCULAR | Status: AC
  Filled 2019-01-17: qty 5

## 2019-01-17 MED ORDER — POVIDONE-IODINE 10 % EX SWAB: 2.0000 "application " | Freq: Once | CUTANEOUS | Status: DC

## 2019-01-17 MED ORDER — DEXAMETHASONE SODIUM PHOSPHATE 10 MG/ML IJ SOLN
INTRAMUSCULAR | Status: DC | PRN
  Administered 2019-01-17: 10 mg via INTRAVENOUS

## 2019-01-17 MED ORDER — ACETAMINOPHEN 500 MG PO TABS
1000.0000 mg | ORAL_TABLET | Freq: Once | ORAL | Status: AC
  Administered 2019-01-17: 1000 mg via ORAL

## 2019-01-17 MED ORDER — MIDAZOLAM HCL 2 MG/2ML IJ SOLN
1.0000 mg | INTRAMUSCULAR | Status: DC | PRN
  Administered 2019-01-17: 2 mg via INTRAVENOUS

## 2019-01-17 MED ORDER — CEFAZOLIN SODIUM-DEXTROSE 2-4 GM/100ML-% IV SOLN
2.0000 g | INTRAVENOUS | Status: AC
  Administered 2019-01-17: 2 g via INTRAVENOUS

## 2019-01-17 MED ORDER — ACETAMINOPHEN 500 MG PO TABS
ORAL_TABLET | ORAL | Status: AC
  Filled 2019-01-17: qty 2

## 2019-01-17 MED ORDER — BUPIVACAINE-EPINEPHRINE (PF) 0.5% -1:200000 IJ SOLN
INTRAMUSCULAR | Status: DC | PRN
  Administered 2019-01-17: 10 mL via PERINEURAL
  Administered 2019-01-17: 30 mL via PERINEURAL

## 2019-01-17 MED ORDER — CLONIDINE HCL (ANALGESIA) 100 MCG/ML EP SOLN
EPIDURAL | Status: DC | PRN
  Administered 2019-01-17: 100 ug

## 2019-01-17 MED ORDER — PROPOFOL 10 MG/ML IV BOLUS
INTRAVENOUS | Status: DC | PRN
  Administered 2019-01-17: 200 mg via INTRAVENOUS
  Administered 2019-01-17 (×2): 50 mg via INTRAVENOUS

## 2019-01-17 MED ORDER — LACTATED RINGERS IV SOLN
INTRAVENOUS | Status: DC
  Administered 2019-01-17 (×2): via INTRAVENOUS

## 2019-01-17 MED ORDER — ONDANSETRON HCL 4 MG/2ML IJ SOLN
INTRAMUSCULAR | Status: DC | PRN
  Administered 2019-01-17: 4 mg via INTRAVENOUS

## 2019-01-17 MED ORDER — ONDANSETRON HCL 4 MG/2ML IJ SOLN
INTRAMUSCULAR | Status: AC
  Filled 2019-01-17: qty 4

## 2019-01-17 MED ORDER — LIDOCAINE 2% (20 MG/ML) 5 ML SYRINGE
INTRAMUSCULAR | Status: DC | PRN
  Administered 2019-01-17: 100 mg via INTRAVENOUS

## 2019-01-17 MED ORDER — FENTANYL CITRATE (PF) 100 MCG/2ML IJ SOLN
50.0000 ug | INTRAMUSCULAR | Status: AC | PRN
  Administered 2019-01-17: 50 ug via INTRAVENOUS
  Administered 2019-01-17 (×2): 25 ug via INTRAVENOUS
  Administered 2019-01-17: 100 ug via INTRAVENOUS

## 2019-01-17 MED ORDER — OXYCODONE HCL 5 MG PO TABS: 5.0000 mg | ORAL_TABLET | ORAL | 0 refills | Status: DC | PRN

## 2019-01-17 MED ORDER — CELECOXIB 200 MG PO CAPS
200.0000 mg | ORAL_CAPSULE | Freq: Once | ORAL | Status: AC
  Administered 2019-01-17: 200 mg via ORAL

## 2019-01-17 SURGICAL SUPPLY — 78 items
ANCHOR SUT 1.45 SZ 1 SHORT (Anchor) ×3 IMPLANT
ANKLE SYNDEMOSIS ZIPTIGHT (Ankle) ×3 IMPLANT
BANDAGE ESMARK 6X9 LF (GAUZE/BANDAGES/DRESSINGS) ×1 IMPLANT
BIT DRILL 110X2.5XQCK CNCT (BIT) ×1 IMPLANT
BIT DRILL 2.5 (BIT) ×2
BIT DRL 110X2.5XQCK CNCT (BIT) ×1
BLADE SURG 15 STRL LF DISP TIS (BLADE) ×3 IMPLANT
BLADE SURG 15 STRL SS (BLADE) ×6
BNDG COHESIVE 4X5 TAN STRL (GAUZE/BANDAGES/DRESSINGS) ×3 IMPLANT
BNDG ELASTIC 4X5.8 VLCR STR LF (GAUZE/BANDAGES/DRESSINGS) ×3 IMPLANT
BNDG ELASTIC 6X5.8 VLCR STR LF (GAUZE/BANDAGES/DRESSINGS) ×3 IMPLANT
BNDG ESMARK 6X9 LF (GAUZE/BANDAGES/DRESSINGS) ×3
CANISTER SUCT 1200ML W/VALVE (MISCELLANEOUS) ×3 IMPLANT
CLOSURE STERI-STRIP 1/2X4 (GAUZE/BANDAGES/DRESSINGS) ×2
CLSR STERI-STRIP ANTIMIC 1/2X4 (GAUZE/BANDAGES/DRESSINGS) ×4 IMPLANT
COVER BACK TABLE REUSABLE LG (DRAPES) ×3 IMPLANT
COVER WAND RF STERILE (DRAPES) IMPLANT
CUFF TOURN SGL QUICK 34 (TOURNIQUET CUFF) ×2
CUFF TRNQT CYL 34X4.125X (TOURNIQUET CUFF) ×1 IMPLANT
DECANTER SPIKE VIAL GLASS SM (MISCELLANEOUS) IMPLANT
DRAPE C-ARM 42X72 X-RAY (DRAPES) IMPLANT
DRAPE C-ARMOR (DRAPES) IMPLANT
DRAPE EXTREMITY T 121X128X90 (DISPOSABLE) ×3 IMPLANT
DRAPE HALF SHEET 70X43 (DRAPES) IMPLANT
DRAPE IMP U-DRAPE 54X76 (DRAPES) ×3 IMPLANT
DRAPE INCISE IOBAN 66X45 STRL (DRAPES) ×3 IMPLANT
DRAPE OEC MINIVIEW 54X84 (DRAPES) ×3 IMPLANT
DRAPE U-SHAPE 47X51 STRL (DRAPES) ×3 IMPLANT
DRSG ADAPTIC 3X8 NADH LF (GAUZE/BANDAGES/DRESSINGS) IMPLANT
DRSG PAD ABDOMINAL 8X10 ST (GAUZE/BANDAGES/DRESSINGS) ×6 IMPLANT
DURAPREP 26ML APPLICATOR (WOUND CARE) ×3 IMPLANT
ELECT REM PT RETURN 9FT ADLT (ELECTROSURGICAL) ×3
ELECTRODE REM PT RTRN 9FT ADLT (ELECTROSURGICAL) ×1 IMPLANT
GAUZE SPONGE 4X4 12PLY STRL (GAUZE/BANDAGES/DRESSINGS) ×3 IMPLANT
GLOVE BIO SURGEON STRL SZ8 (GLOVE) ×3 IMPLANT
GLOVE BIOGEL PI IND STRL 7.0 (GLOVE) ×2 IMPLANT
GLOVE BIOGEL PI IND STRL 8 (GLOVE) ×2 IMPLANT
GLOVE BIOGEL PI INDICATOR 7.0 (GLOVE) ×4
GLOVE BIOGEL PI INDICATOR 8 (GLOVE) ×4
GLOVE ECLIPSE 6.5 STRL STRAW (GLOVE) ×3 IMPLANT
GLOVE ORTHO TXT STRL SZ7.5 (GLOVE) ×3 IMPLANT
GOWN STRL REUS W/ TWL LRG LVL3 (GOWN DISPOSABLE) ×1 IMPLANT
GOWN STRL REUS W/ TWL XL LVL3 (GOWN DISPOSABLE) ×2 IMPLANT
GOWN STRL REUS W/TWL LRG LVL3 (GOWN DISPOSABLE) ×2
GOWN STRL REUS W/TWL XL LVL3 (GOWN DISPOSABLE) ×4
NEEDLE HYPO 25X1 1.5 SAFETY (NEEDLE) IMPLANT
NS IRRIG 1000ML POUR BTL (IV SOLUTION) ×3 IMPLANT
PACK BASIN DAY SURGERY FS (CUSTOM PROCEDURE TRAY) ×3 IMPLANT
PAD CAST 4YDX4 CTTN HI CHSV (CAST SUPPLIES) ×2 IMPLANT
PADDING CAST COTTON 4X4 STRL (CAST SUPPLIES) ×4
PENCIL BUTTON HOLSTER BLD 10FT (ELECTRODE) ×3 IMPLANT
PLATE IMPLANT TUB 10H 121MM (Plate) ×3 IMPLANT
SCREW CORTICAL 3.5 16MM (Screw) ×6 IMPLANT
SCREW CORTICAL 3.5X14 (Screw) ×12 IMPLANT
SLEEVE SCD COMPRESS KNEE MED (MISCELLANEOUS) ×3 IMPLANT
SPLINT FAST PLASTER 5X30 (CAST SUPPLIES) ×40
SPLINT PLASTER CAST FAST 5X30 (CAST SUPPLIES) ×20 IMPLANT
SPONGE LAP 4X18 RFD (DISPOSABLE) ×3 IMPLANT
STAPLER VISISTAT 35W (STAPLE) IMPLANT
SUCTION FRAZIER HANDLE 10FR (MISCELLANEOUS) ×2
SUCTION TUBE FRAZIER 10FR DISP (MISCELLANEOUS) ×1 IMPLANT
SUT ETHILON 3 0 PS 1 (SUTURE) IMPLANT
SUT ETHILON 4 0 PS 2 18 (SUTURE) IMPLANT
SUT MNCRL AB 4-0 PS2 18 (SUTURE) IMPLANT
SUT VIC AB 0 CT1 27 (SUTURE) ×2
SUT VIC AB 0 CT1 27XBRD ANBCTR (SUTURE) ×1 IMPLANT
SUT VIC AB 2-0 SH 18 (SUTURE) IMPLANT
SUT VIC AB 3-0 SH 27 (SUTURE)
SUT VIC AB 3-0 SH 27X BRD (SUTURE) IMPLANT
SUT VICRYL 3-0 CR8 SH (SUTURE) ×3 IMPLANT
SYR BULB 3OZ (MISCELLANEOUS) ×3 IMPLANT
SYR CONTROL 10ML LL (SYRINGE) IMPLANT
SYSTEM FIXATN ANKL SYNDESMOSIS (Ankle) ×1 IMPLANT
TOWEL GREEN STERILE FF (TOWEL DISPOSABLE) ×6 IMPLANT
TUBE CONNECTING 20'X1/4 (TUBING) ×1
TUBE CONNECTING 20X1/4 (TUBING) ×2 IMPLANT
UNDERPAD 30X30 (UNDERPADS AND DIAPERS) ×3 IMPLANT
YANKAUER SUCT BULB TIP NO VENT (SUCTIONS) ×3 IMPLANT

## 2019-01-17 NOTE — Anesthesia Procedure Notes (Signed)
Anesthesia Regional Block: Popliteal block   Pre-Anesthetic Checklist: ,, timeout performed, Correct Patient, Correct Site, Correct Laterality, Correct Procedure, Correct Position, site marked, Risks and benefits discussed,  Surgical consent,  Pre-op evaluation,  At surgeon's request and post-op pain management  Laterality: Left  Prep: chloraprep       Needles:  Injection technique: Single-shot  Needle Type: Echogenic Needle     Needle Length: 9cm  Needle Gauge: 21     Additional Needles:   Procedures:,,,, ultrasound used (permanent image in chart),,,,  Narrative:  Start time: 01/17/2019 12:20 PM End time: 01/17/2019 12:30 PM Injection made incrementally with aspirations every 5 mL.  Performed by: Personally  Anesthesiologist: Catalina Gravel, MD  Additional Notes: No pain on injection. No increased resistance to injection. Injection made in 5cc increments.  Good needle visualization.  Patient tolerated procedure well.

## 2019-01-17 NOTE — Anesthesia Procedure Notes (Signed)
Anesthesia Regional Block: Adductor canal block   Pre-Anesthetic Checklist: ,, timeout performed, Correct Patient, Correct Site, Correct Laterality, Correct Procedure, Correct Position, site marked, Risks and benefits discussed,  Surgical consent,  Pre-op evaluation,  At surgeon's request and post-op pain management  Laterality: Left  Prep: chloraprep       Needles:  Injection technique: Single-shot  Needle Type: Echogenic Needle     Needle Length: 9cm  Needle Gauge: 21     Additional Needles:   Procedures:,,,, ultrasound used (permanent image in chart),,,,  Narrative:  Start time: 01/17/2019 12:30 PM End time: 01/17/2019 12:35 PM Injection made incrementally with aspirations every 5 mL.  Performed by: Personally  Anesthesiologist: Catalina Gravel, MD  Additional Notes: No pain on injection. No increased resistance to injection. Injection made in 5cc increments.  Good needle visualization.  Patient tolerated procedure well.

## 2019-01-17 NOTE — Transfer of Care (Signed)
Immediate Anesthesia Transfer of Care Note  Patient: Trevor Chavez  Procedure(s) Performed: OPEN REDUCTION INTERNAL FIXATION (ORIF) BIMALLEOAR AND SYNDESMOSIS (Left Ankle)  Patient Location: PACU  Anesthesia Type:GA combined with regional for post-op pain  Level of Consciousness: drowsy and patient cooperative  Airway & Oxygen Therapy: Patient Spontanous Breathing and Patient connected to nasal cannula oxygen  Post-op Assessment: Report given to RN and Post -op Vital signs reviewed and stable  Post vital signs: Reviewed and stable  Last Vitals:  Vitals Value Taken Time  BP    Temp    Pulse 70 01/17/19 1456  Resp 10 01/17/19 1456  SpO2 100 % 01/17/19 1456  Vitals shown include unvalidated device data.  Last Pain:  Vitals:   01/17/19 1145  TempSrc: Oral  PainSc: 0-No pain         Complications: No apparent anesthesia complications

## 2019-01-17 NOTE — Discharge Instructions (Signed)
**No Ibuprofen or Tylenol until after 7pm  Diet: As you were doing prior to hospitalization   Shower:  May shower but keep the wounds dry, use an occlusive plastic wrap, NO SOAKING IN TUB.  If the bandage gets wet, change with a clean dry gauze.  If you have a splint on, leave the splint in place and keep the splint dry with a plastic bag.  Dressing:  You may change your dressing 3-5 days after surgery, unless you have a splint.  If you have a splint, then just leave the splint in place and we will change your bandages during your first follow-up appointment.    If you had hand or foot surgery, we will plan to remove your stitches in about 2 weeks in the office.  For all other surgeries, there are sticky tapes (steri-strips) on your wounds and all the stitches are absorbable.  Leave the steri-strips in place when changing your dressings, they will peel off with time, usually 2-3 weeks.  Activity:  Increase activity slowly as tolerated, but follow the weight bearing instructions below.  The rules on driving is that you can not be taking narcotics while you drive, and you must feel in control of the vehicle.    Weight Bearing:   No weight on left leg, keep elevated.  .    To prevent constipation: you may use a stool softener such as -  Colace (over the counter) 100 mg by mouth twice a day  Drink plenty of fluids (prune juice may be helpful) and high fiber foods Miralax (over the counter) for constipation as needed.    Itching:  If you experience itching with your medications, try taking only a single pain pill, or even half a pain pill at a time.  You may take up to 10 pain pills per day, and you can also use benadryl over the counter for itching or also to help with sleep.   Precautions:  If you experience chest pain or shortness of breath - call 911 immediately for transfer to the hospital emergency department!!  If you develop a fever greater that 101 F, purulent drainage from wound,  increased redness or drainage from wound, or calf pain -- Call the office at [phone]                                                Follow- Up Appointment:  Please call for an appointment to be seen in 2 [place] - [phone]    Post Anesthesia Home Care Instructions  Activity: Get plenty of rest for the remainder of the day. A responsible individual must stay with you for 24 hours following the procedure.  For the next 24 hours, DO NOT: -Drive a car -Advertising copywriterperate machinery -Drink alcoholic beverages -Take any medication unless instructed by your physician -Make any legal decisions or sign important papers.  Meals: Start with liquid foods such as gelatin or soup. Progress to regular foods as tolerated. Avoid greasy, spicy, heavy foods. If nausea and/or vomiting occur, drink only clear liquids until the nausea and/or vomiting subsides. Call your physician if vomiting continues.  Special Instructions/Symptoms: Your throat may feel dry or sore from the anesthesia or the breathing tube placed in your throat during surgery. If this causes discomfort, gargle with warm salt water. The discomfort should disappear within 24 hours.  If you had a scopolamine patch placed behind your ear for the management of post- operative nausea and/or vomiting:  1. The medication in the patch is effective for 72 hours, after which it should be removed.  Wrap patch in a tissue and discard in the trash. Wash hands thoroughly with soap and water. 2. You may remove the patch earlier than 72 hours if you experience unpleasant side effects which may include dry mouth, dizziness or visual disturbances. 3. Avoid touching the patch. Wash your hands with soap and water after contact with the patch.    Regional Anesthesia Blocks  1. Numbness or the inability to move the "blocked" extremity may last from 3-48 hours after placement. The length of time depends on the medication injected and your individual  response to the medication. If the numbness is not going away after 48 hours, call your surgeon.  2. The extremity that is blocked will need to be protected until the numbness is gone and the  Strength has returned. Because you cannot feel it, you will need to take extra care to avoid injury. Because it may be weak, you may have difficulty moving it or using it. You may not know what position it is in without looking at it while the block is in effect.  3. For blocks in the legs and feet, returning to weight bearing and walking needs to be done carefully. You will need to wait until the numbness is entirely gone and the strength has returned. You should be able to move your leg and foot normally before you try and bear weight or walk. You will need someone to be with you when you first try to ensure you do not fall and possibly risk injury.  4. Bruising and tenderness at the needle site are common side effects and will resolve in a few days.  5. Persistent numbness or new problems with movement should be communicated to the surgeon or the [place] [phone] [place] [phone]).

## 2019-01-17 NOTE — Anesthesia Postprocedure Evaluation (Signed)
Anesthesia Post Note  Patient: Trevor Chavez  Procedure(s) Performed: OPEN REDUCTION INTERNAL FIXATION (ORIF) BIMALLEOAR AND SYNDESMOSIS (Left Ankle)     Patient location during evaluation: PACU Anesthesia Type: General Level of consciousness: awake and alert Pain management: pain level controlled Vital Signs Assessment: post-procedure vital signs reviewed and stable Respiratory status: spontaneous breathing, nonlabored ventilation, respiratory function stable and patient connected to nasal cannula oxygen Cardiovascular status: blood pressure returned to baseline and stable Postop Assessment: no apparent nausea or vomiting Anesthetic complications: no    Last Vitals:  Vitals:   01/17/19 1530 01/17/19 1600  BP: (!) 96/56 (!) 101/58  Pulse: 68 60  Resp: 15 18  Temp:  36.5 C  SpO2: 98% 99%    Last Pain:  Vitals:   01/17/19 1600  TempSrc:   PainSc: 0-No pain                 Catalina Gravel

## 2019-01-17 NOTE — Progress Notes (Signed)
Assisted Dr. Turk with left, ultrasound guided, popliteal, adductor canal block. Side rails up, monitors on throughout procedure. See vital signs in flow sheet. Tolerated Procedure well. °
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
## 2019-01-17 NOTE — Anesthesia Preprocedure Evaluation (Signed)
Anesthesia Evaluation  Patient identified by MRN, date of birth, ID band Patient awake    Reviewed: Allergy & Precautions, NPO status , Patient's Chart, lab work & pertinent test results  Airway Mallampati: II  TM Distance: >3 FB Neck ROM: Full    Dental  (+) Teeth Intact, Dental Advisory Given   Pulmonary neg pulmonary ROS,    Pulmonary exam normal breath sounds clear to auscultation       Cardiovascular negative cardio ROS Normal cardiovascular exam Rhythm:Regular Rate:Normal     Neuro/Psych negative neurological ROS     GI/Hepatic negative GI ROS, Neg liver ROS,   Endo/Other  negative endocrine ROS  Renal/GU negative Renal ROS     Musculoskeletal LEFT ANKLE FRACTURE   Abdominal   Peds  Hematology negative hematology ROS (+)   Anesthesia Other Findings Day of surgery medications reviewed with the patient.  Reproductive/Obstetrics                             Anesthesia Physical Anesthesia Plan  ASA: I  Anesthesia Plan: General   Post-op Pain Management:    Induction: Intravenous  PONV Risk Score and Plan: 3 and Midazolam, Dexamethasone and Ondansetron  Airway Management Planned: LMA  Additional Equipment:   Intra-op Plan:   Post-operative Plan: Extubation in OR  Informed Consent: I have reviewed the patients History and Physical, chart, labs and discussed the procedure including the risks, benefits and alternatives for the proposed anesthesia with the patient or authorized representative who has indicated his/her understanding and acceptance.     Dental advisory given  Plan Discussed with: CRNA  Anesthesia Plan Comments:         Anesthesia Quick Evaluation
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
## 2019-01-17 NOTE — Anesthesia Procedure Notes (Signed)
Procedure Name: LMA Insertion Date/Time: 01/17/2019 1:09 PM Performed by: Eulas Post, Neda Willenbring W, CRNA Pre-anesthesia Checklist: Patient identified, Emergency Drugs available, Suction available and Patient being monitored Patient Re-evaluated:Patient Re-evaluated prior to induction Oxygen Delivery Method: Circle system utilized Preoxygenation: Pre-oxygenation with 100% oxygen Induction Type: IV induction Ventilation: Mask ventilation without difficulty LMA: LMA inserted LMA Size: 5.0 Number of attempts: 1 Placement Confirmation: positive ETCO2 and breath sounds checked- equal and bilateral Tube secured with: Tape Dental Injury: Teeth and Oropharynx as per pre-operative assessment

## 2019-01-18 ENCOUNTER — Encounter (HOSPITAL_BASED_OUTPATIENT_CLINIC_OR_DEPARTMENT_OTHER): Payer: Self-pay | Admitting: Orthopedic Surgery

## 2019-06-08 ENCOUNTER — Ambulatory Visit: Payer: BC Managed Care – PPO | Attending: Internal Medicine

## 2019-06-08 ENCOUNTER — Other Ambulatory Visit: Payer: Self-pay

## 2019-06-08 DIAGNOSIS — Z20822 Contact with and (suspected) exposure to covid-19: Secondary | ICD-10-CM

## 2019-06-10 LAB — NOVEL CORONAVIRUS, NAA: SARS-CoV-2, NAA: NOT DETECTED

## 2019-07-10 ENCOUNTER — Ambulatory Visit: Payer: BLUE CROSS/BLUE SHIELD | Admitting: Family Medicine

## 2019-09-04 ENCOUNTER — Other Ambulatory Visit: Payer: Self-pay

## 2019-09-04 ENCOUNTER — Emergency Department (HOSPITAL_COMMUNITY)
Admission: EM | Admit: 2019-09-04 | Discharge: 2019-09-05 | Disposition: A | Discharge status: Home / Self Care | Payer: BC Managed Care – PPO | Attending: Emergency Medicine | Admitting: Emergency Medicine

## 2019-09-04 ENCOUNTER — Encounter (HOSPITAL_COMMUNITY): Payer: Self-pay | Admitting: Emergency Medicine

## 2019-09-04 ENCOUNTER — Emergency Department (HOSPITAL_COMMUNITY): Payer: BC Managed Care – PPO

## 2019-09-04 DIAGNOSIS — M79672 Pain in left foot: Secondary | ICD-10-CM | POA: Diagnosis present

## 2019-09-04 DIAGNOSIS — Z5321 Procedure and treatment not carried out due to patient leaving prior to being seen by health care provider: Secondary | ICD-10-CM | POA: Diagnosis not present

## 2019-09-04 NOTE — ED Triage Notes (Signed)
Patient reports left foot pain injured at work this evening , patient stated heavy box fell on his left foot .

## 2019-09-05 NOTE — ED Notes (Signed)
Pt came to this tech asking about how long the wait time was. This tech provided the longest wait along with information about how the ED process works. Pt stated he could not wait any longer because he has an appointment in the morning, and that he was leaving.

## 2020-01-08 ENCOUNTER — Telehealth: Payer: Self-pay | Admitting: Family Medicine

## 2020-01-08 NOTE — Telephone Encounter (Signed)
Pt had 1st Covid Shot on Saturday and said now he is throwing up and wants a work note to be out of work today.   Pt call back number [phone]

## 2020-01-08 NOTE — Telephone Encounter (Signed)
Call placed to patient.   Reports that he Pzifer vaccine on Saturday, but was awakened in the middle of the night on Sunday with nausea and vomiting. Reports that he has vomited so much that he has nothing but bile coming up now. Reports that he has abd pain, diarrhea, body aches and chills.   Advised that Sx are related to COVID, not the vaccine. Advised to self- quarantine and virtual visit scheduled with PCP.

## 2020-01-09 ENCOUNTER — Encounter: Payer: Self-pay | Admitting: Family Medicine

## 2020-01-09 ENCOUNTER — Telehealth: Payer: BC Managed Care – PPO | Admitting: Family Medicine

## 2020-01-09 ENCOUNTER — Other Ambulatory Visit: Payer: Self-pay

## 2020-01-09 DIAGNOSIS — R197 Diarrhea, unspecified: Secondary | ICD-10-CM

## 2020-01-09 MED ORDER — DIPHENOXYLATE-ATROPINE 2.5-0.025 MG PO TABS: 2.0000 | ORAL_TABLET | Freq: Four times a day (QID) | ORAL | 0 refills | Status: DC | PRN

## 2020-01-09 NOTE — Progress Notes (Signed)
Subjective:    Patient ID: Trevor Chavez, male    DOB: 02-Jul-1975, 44 y.o.   MRN: [id number]  HPI Patient is being seen today as a video visit.  He consents to be seen via video visit.  Video conference call began at 1107.  It concluded at 1121.  The patient will was at his home.  I am at my office.  Saturday the patient received the Covid vaccination/Pfizer vaccine.  Sunday night into Monday morning he developed a severe vomiting and diarrhea.  He continues to have severe diarrhea.  He denies any bloody diarrhea.  He denies any abdominal pain.  The vomiting has improved.  He is now keeping down liquids.  He is having chills and body aches and low-grade fever.  All of this began 24 hours after his first dose of [employer] vaccine.  He denies any sick contacts Past Medical History:  Diagnosis Date  . Closed displaced Maisonneuve fracture of left lower extremity 01/17/2019  . Left fibular fracture    Past Surgical History:  Procedure Laterality Date  . NO PAST SURGERIES    . ORIF ANKLE FRACTURE Left 01/17/2019   Procedure: OPEN REDUCTION INTERNAL FIXATION (ORIF) BIMALLEOAR AND SYNDESMOSIS;  Surgeon: Teryl Lucy, MD;  Location: [facility];  Service: Orthopedics;  Laterality: Left;   Current Outpatient Medications on File Prior to Visit  Medication Sig Dispense Refill  . ondansetron (ZOFRAN) 4 MG tablet Take 1 tablet (4 mg total) by mouth every 8 (eight) hours as needed for nausea or vomiting. 10 tablet 0  . oxyCODONE (ROXICODONE) 5 MG immediate release tablet Take 1 tablet (5 mg total) by mouth every 4 (four) hours as needed for severe pain. 30 tablet 0  . sennosides-docusate sodium (SENOKOT-S) 8.6-50 MG tablet Take 2 tablets by mouth daily. 30 tablet 1   No current facility-administered medications on file prior to visit.   No Known Allergies Social History   Socioeconomic History  . Marital status: Married    Spouse name: Not on file  . Number of children: Not on file   . Years of education: Not on file  . Highest education level: Not on file  Occupational History  . Not on file  Tobacco Use  . Smoking status: Never Smoker  . Smokeless tobacco: Never Used  Substance and Sexual Activity  . Alcohol use: Yes    Alcohol/week: 0.0 standard drinks    Comment: Occasional  . Drug use: No  . Sexual activity: Yes  Other Topics Concern  . Not on file  Social History Narrative  . Not on file   Social Determinants of Health   Financial Resource Strain:   . Difficulty of Paying Living Expenses:   Food Insecurity:   . Worried About Programme researcher, broadcasting/film/video in the Last Year:   . Barista in the Last Year:   Transportation Needs:   . Freight forwarder (Medical):   Marland Kitchen Lack of Transportation (Non-Medical):   Physical Activity:   . Days of Exercise per Week:   . Minutes of Exercise per Session:   Stress:   . Feeling of Stress :   Social Connections:   . Frequency of Communication with Friends and Family:   . Frequency of Social Gatherings with Friends and Family:   . Attends Religious Services:   . Active Member of Clubs or Organizations:   . Attends Banker Meetings:   Marland Kitchen Marital Status:   Intimate  Partner Violence:   . Fear of Current or Ex-Partner:   . Emotionally Abused:   Marland Kitchen Physically Abused:   . Sexually Abused:       Review of Systems  All other systems reviewed and are negative.      Objective:   Physical Exam        Assessment & Plan:  Diarrhea, unspecified type - Plan: diphenoxylate-atropine (LOMOTIL) 2.5-0.025 MG tablet, SARS-COV-2 RNA,(COVID-19) QUAL NAAT  Based on his lack of abdominal pain and the fact he is tolerating fluid now, I do not believe the patient has any type of bowel obstruction.  Differential diagnosis includes a reaction to the vaccine which is the most likely explanation versus viral gastroenteritis.  I will treat the patient with Lomotil 2 tablets every 6 hours for diarrhea and asked him  to push fluids.  I will write the patient out of work for yesterday today and tomorrow or until the diarrhea is better.  If it is due to the vaccine I suspect that he should be better by tomorrow.  I also recommended that he be tested for Covid.  He can come by here today at any time and I will be glad to swab him.  However I emphasized to the patient that he cannot catch Covid from the vaccine.  If he is "ill" due to the vaccine it is due to a reaction from his immune system and not from the virus itself.  Therefore if this is a reaction to the vaccine he is not contagious.

## 2020-06-21 ENCOUNTER — Other Ambulatory Visit: Payer: Self-pay

## 2020-06-21 ENCOUNTER — Telehealth (INDEPENDENT_AMBULATORY_CARE_PROVIDER_SITE_OTHER): Payer: BC Managed Care – PPO | Admitting: Family Medicine

## 2020-06-21 DIAGNOSIS — B9689 Other specified bacterial agents as the cause of diseases classified elsewhere: Secondary | ICD-10-CM | POA: Diagnosis not present

## 2020-06-21 DIAGNOSIS — J019 Acute sinusitis, unspecified: Secondary | ICD-10-CM | POA: Diagnosis not present

## 2020-06-21 MED ORDER — FLUTICASONE PROPIONATE 50 MCG/ACT NA SUSP: 2.0000 | Freq: Every day | NASAL | 6 refills | Status: DC

## 2020-06-21 MED ORDER — AMOXICILLIN-POT CLAVULANATE 875-125 MG PO TABS: 1.0000 | ORAL_TABLET | Freq: Two times a day (BID) | ORAL | 0 refills | Status: DC

## 2020-06-21 NOTE — Progress Notes (Signed)
Subjective:    Patient ID: Trevor Chavez, male    DOB: 01-11-1976, 45 y.o.   MRN: [id number]  HPI Patient is a very pleasant 45 year old African-American gentleman being seen today as a telephone visit.  He is currently at home.  I am currently in my office.  He consents to be seen via telephone.  Phone call began at 850.  Phone call concluded at 901.  Patient states that he has had a cold off and on for more than 3 weeks.  He believes it has turned into a sinus infection.  He states that his sinuses feel "clogged up".  Both nostrils are stopped up completely such that he is having a difficult time breathing through his nose.  He has copious rhinorrhea on a daily basis.  He has pain in his left cheekbone and around his left eye.  When he blows his nose he gets bloody mucus.  Sometimes it is thick and chunky mucus.  It is no longer clear "snot".  He denies any shortness of breath.  He denies any chest pain.  He denies any cough.  He denies any fever.  He has been taking Mucinex every day with no relief.  His wife, his son, and his daughter are home and healthy.  None of them are sick leading me to believe that this is likely a sinus infection rather than a cold or virus Past Medical History:  Diagnosis Date  . Closed displaced Maisonneuve fracture of left lower extremity 01/17/2019  . Left fibular fracture    Past Surgical History:  Procedure Laterality Date  . NO PAST SURGERIES    . ORIF ANKLE FRACTURE Left 01/17/2019   Procedure: OPEN REDUCTION INTERNAL FIXATION (ORIF) BIMALLEOAR AND SYNDESMOSIS;  Surgeon: Teryl Lucy, MD;  Location: [facility];  Service: Orthopedics;  Laterality: Left;   Current Outpatient Medications on File Prior to Visit  Medication Sig Dispense Refill  . diphenoxylate-atropine (LOMOTIL) 2.5-0.025 MG tablet Take 2 tablets by mouth 4 (four) times daily as needed for diarrhea or loose stools. 30 tablet 0  . ondansetron (ZOFRAN) 4 MG tablet Take 1 tablet (4  mg total) by mouth every 8 (eight) hours as needed for nausea or vomiting. 10 tablet 0  . oxyCODONE (ROXICODONE) 5 MG immediate release tablet Take 1 tablet (5 mg total) by mouth every 4 (four) hours as needed for severe pain. 30 tablet 0  . sennosides-docusate sodium (SENOKOT-S) 8.6-50 MG tablet Take 2 tablets by mouth daily. 30 tablet 1   No current facility-administered medications on file prior to visit.   No Known Allergies Social History   Socioeconomic History  . Marital status: Married    Spouse name: Not on file  . Number of children: Not on file  . Years of education: Not on file  . Highest education level: Not on file  Occupational History  . Not on file  Tobacco Use  . Smoking status: Never Smoker  . Smokeless tobacco: Never Used  Substance and Sexual Activity  . Alcohol use: Yes    Alcohol/week: 0.0 standard drinks    Comment: Occasional  . Drug use: No  . Sexual activity: Yes  Other Topics Concern  . Not on file  Social History Narrative  . Not on file   Social Determinants of Health   Financial Resource Strain: Not on file  Food Insecurity: Not on file  Transportation Needs: Not on file  Physical Activity: Not on file  Stress: Not  on file  Social Connections: Not on file  Intimate Partner Violence: Not on file      Review of Systems  All other systems reviewed and are negative.      Objective:   Physical Exam        Assessment & Plan:  Acute bacterial rhinosinusitis  Given the duration of symptoms, 21 days, the left facial pain, the purulent nasal drainage, the rhinorrhea, I believe the patient has a bacterial sinus infection.  Begin Augmentin 875 mg twice daily for 10 days.  Use Flonase 2 sprays each nostril daily.  Reassess in 1 week if no better or sooner if worsening.

## 2020-06-24 ENCOUNTER — Telehealth: Payer: BC Managed Care – PPO | Admitting: Family Medicine

## 2021-02-23 IMAGING — DX DG FOOT COMPLETE 3+V*L*
3 series · 3 of 3 positions shown · non-contrast
Comparison: None.

CLINICAL DATA: Crush injury, pain and swelling great toe

EXAM:
LEFT FOOT - COMPLETE 3+ VIEW

[foot ap]
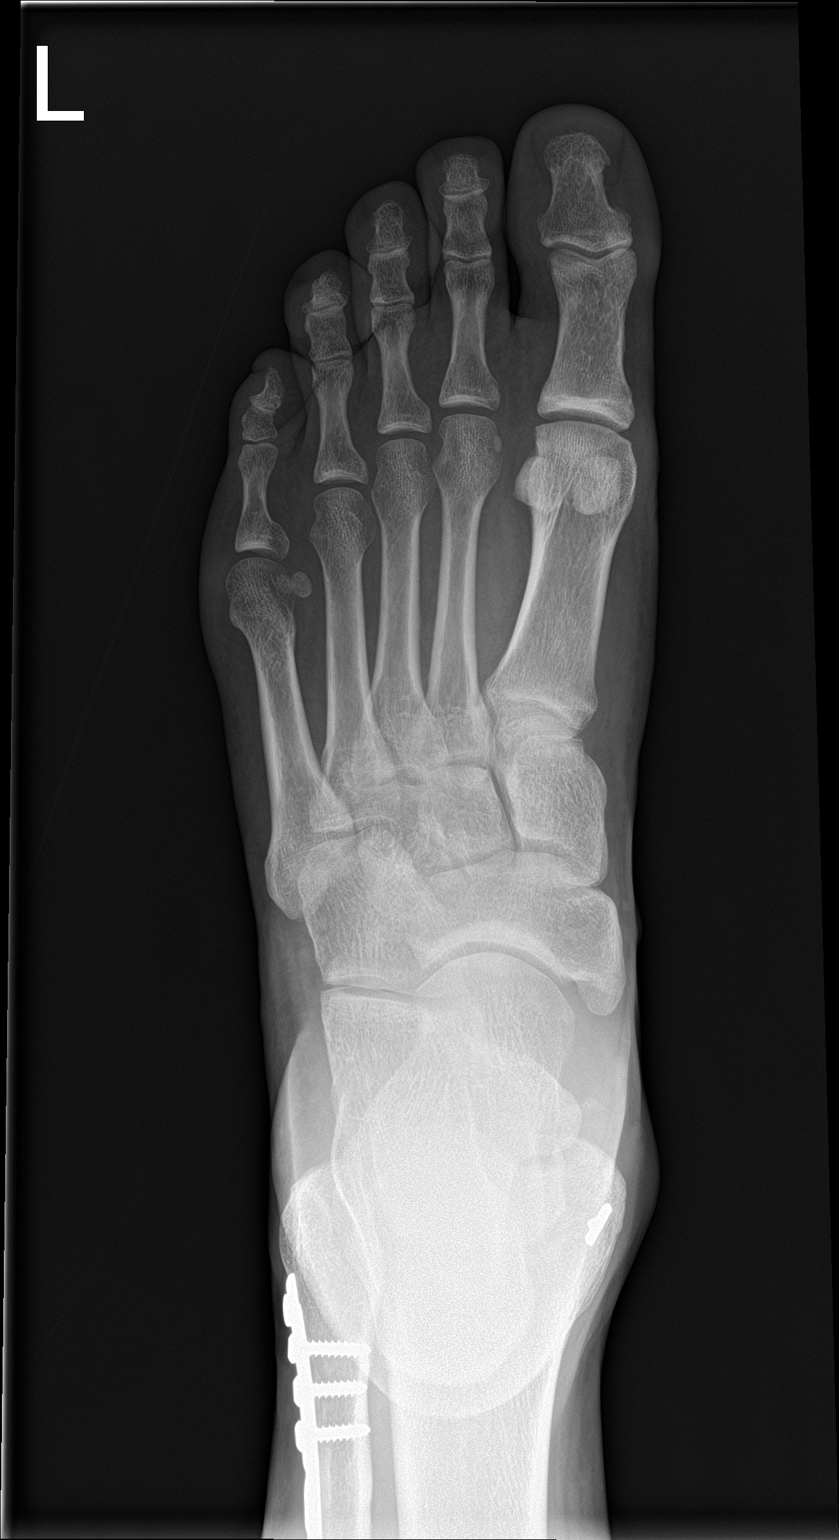

[foot obl]
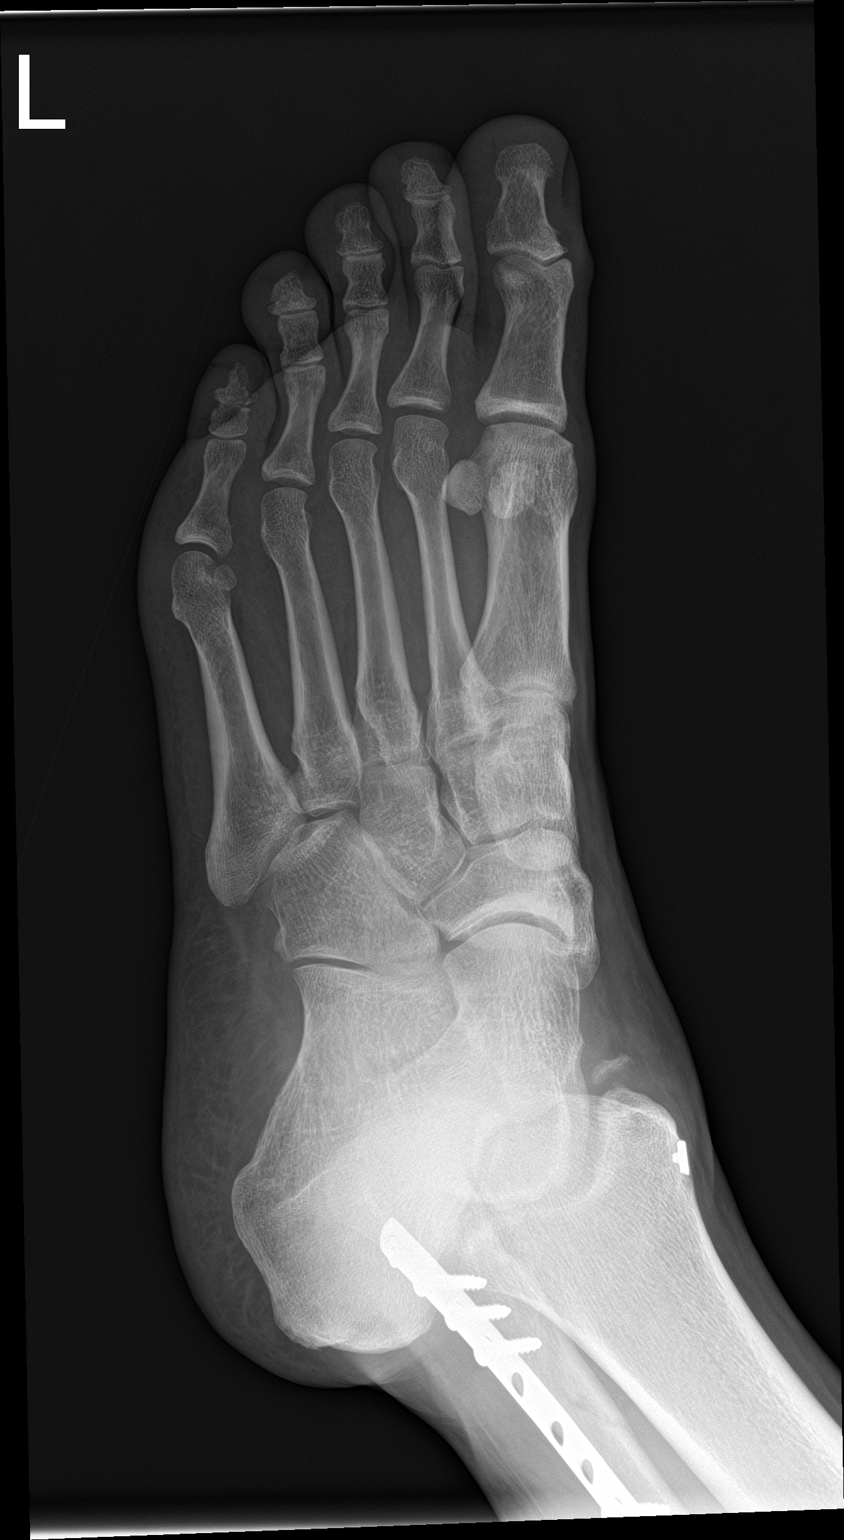

[foot lat]
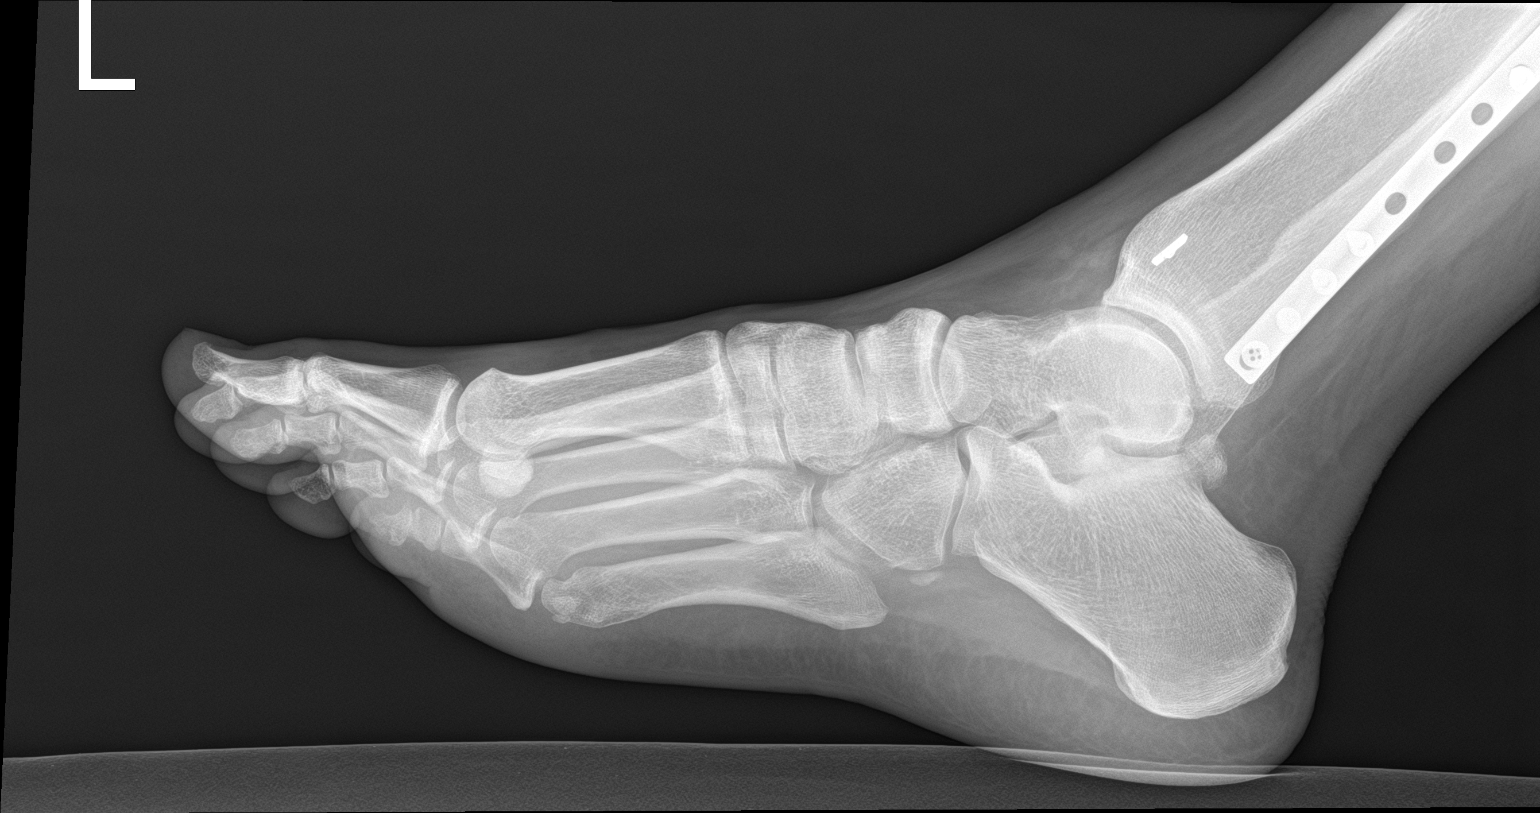

[3 of 3 positions shown; findings below may reference images not displayed]

FINDINGS: Frontal, oblique, and lateral views of the left foot are obtained.
No fracture, subluxation, or dislocation. Joint spaces are well
preserved. Postsurgical changes about the ankle, with plate and
screw fixation of the distal fibula and surgical anchor within the
medial tibia. Soft tissues are normal.
IMPRESSION: 1. No acute bony abnormality.

## 2021-03-04 ENCOUNTER — Encounter: Payer: Self-pay | Admitting: Family Medicine

## 2021-03-04 ENCOUNTER — Ambulatory Visit (INDEPENDENT_AMBULATORY_CARE_PROVIDER_SITE_OTHER): Payer: BC Managed Care – PPO | Admitting: Family Medicine

## 2021-03-04 ENCOUNTER — Other Ambulatory Visit: Payer: Self-pay

## 2021-03-04 VITALS — BP 118/64 | HR 68 | Temp 98.1°F | Resp 16 | Ht 72.0 in | Wt 216.0 lb

## 2021-03-04 DIAGNOSIS — Z1212 Encounter for screening for malignant neoplasm of rectum: Secondary | ICD-10-CM

## 2021-03-04 DIAGNOSIS — Z1211 Encounter for screening for malignant neoplasm of colon: Secondary | ICD-10-CM

## 2021-03-04 DIAGNOSIS — Z0000 Encounter for general adult medical examination without abnormal findings: Secondary | ICD-10-CM | POA: Diagnosis not present

## 2021-03-04 MED ORDER — MELOXICAM 15 MG PO TABS: 15.0000 mg | ORAL_TABLET | Freq: Every day | ORAL | 0 refills | Status: DC

## 2021-03-04 NOTE — Progress Notes (Signed)
Subjective:    Patient ID: Trevor Chavez, male    DOB: May 06, 1976, 45 y.o.   MRN: [id number]  HPI Patient is a very pleasant 45 year old African-American gentleman who presents today for complete physical exam.  In the last 3 weeks he has strained a muscle in his lower back and has left-sided sciatica.  He reports burning stinging pain radiating down his left leg.  Is been gradually improving over the last 3 weeks but it is still present.  He also complains of pain and stiffness in the left side of his lower back.  It sounds like sciatica however it is gradually improving.  He has not been taking any NSAIDs regularly.  Otherwise he is doing well.  He declines a flu shot.  He declines a COVID booster.  He is due for colon cancer screening.  We discussed a colonoscopy however he is interested in Cologuard. Past Medical History:  Diagnosis Date   Closed displaced Maisonneuve fracture of left lower extremity 01/17/2019   Left fibular fracture     Current Outpatient Medications on File Prior to Visit  Medication Sig Dispense Refill   Multiple Vitamin (MULTIVITAMIN WITH MINERALS) TABS tablet Take 1 tablet by mouth daily.     No current facility-administered medications on file prior to visit.   No Known Allergies Social History   Socioeconomic History   Marital status: Married    Spouse name: Not on file   Number of children: Not on file   Years of education: Not on file   Highest education level: Not on file  Occupational History   Not on file  Tobacco Use   Smoking status: Never   Smokeless tobacco: Never  Substance and Sexual Activity   Alcohol use: Yes    Alcohol/week: 0.0 standard drinks    Comment: Occasional   Drug use: No   Sexual activity: Yes  Other Topics Concern   Not on file  Social History Narrative   Not on file   Social Determinants of Health   Financial Resource Strain: Not on file  Food Insecurity: Not on file  Transportation Needs: Not on file  Physical  Activity: Not on file  Stress: Not on file  Social Connections: Not on file  Intimate Partner Violence: Not on file   Family History  Problem Relation Age of Onset   Cancer Mother        lung   Hypertension Father    Mental illness Brother       Review of Systems  All other systems reviewed and are negative.     Objective:   Physical Exam Vitals reviewed.  Constitutional:      General: He is not in acute distress.    Appearance: He is well-developed. He is not diaphoretic.  HENT:     Head: Normocephalic and atraumatic.     Right Ear: External ear normal.     Left Ear: External ear normal.     Nose: Nose normal.     Mouth/Throat:     Pharynx: No oropharyngeal exudate.  Eyes:     General: No scleral icterus.       Right eye: No discharge.        Left eye: No discharge.     Conjunctiva/sclera: Conjunctivae normal.     Pupils: Pupils are equal, round, and reactive to light.  Neck:     Thyroid: No thyromegaly.     Vascular: No JVD.     Trachea: No tracheal  deviation.  Cardiovascular:     Rate and Rhythm: Normal rate and regular rhythm.     Heart sounds: Normal heart sounds. No murmur heard.   No friction rub. No gallop.  Pulmonary:     Effort: Pulmonary effort is normal. No respiratory distress.     Breath sounds: Normal breath sounds. No stridor. No wheezing or rales.  Chest:     Chest wall: No tenderness.  Abdominal:     General: Bowel sounds are normal. There is no distension.     Palpations: Abdomen is soft. There is no mass.     Tenderness: There is no abdominal tenderness. There is no guarding or rebound.     Hernia: There is no hernia in the left inguinal area.  Genitourinary:    Penis: Normal and circumcised.      Testes: Normal.        Right: Mass or tenderness not present.        Left: Mass or tenderness not present.     Prostate: Normal.     Rectum: Normal.  Musculoskeletal:        General: Normal range of motion.     Cervical back: Normal range  of motion and neck supple.  Lymphadenopathy:     Cervical: No cervical adenopathy.     Lower Body: No right inguinal adenopathy. No left inguinal adenopathy.  Skin:    General: Skin is warm.     Coloration: Skin is not pale.     Findings: No erythema or rash.  Neurological:     Mental Status: He is alert and oriented to person, place, and time.     Cranial Nerves: No cranial nerve deficit.     Motor: No abnormal muscle tone.     Coordination: Coordination normal.     Deep Tendon Reflexes: Reflexes are normal and symmetric.  Psychiatric:        Behavior: Behavior normal.        Thought Content: Thought content normal.        Judgment: Judgment normal.          Assessment & Plan:  Screening for malignant neoplasm of the rectum - Plan: Cologuard  Special screening for malignant neoplasms, colon - Plan: Cologuard  General medical exam - Plan: CBC with Differential/Platelet, COMPLETE METABOLIC PANEL WITH GFR, Lipid panel  Patient's physical exam is completely normal.  Tetanus shot is up-to-date.  Offered the patient a flu shot and he politely declined.  Recommended a booster on his COVID vaccination.  Schedule the patient for Cologuard to screen for colon cancer.  Check CBC CMP and lipid panel.  Patient has left-sided sciatica.  Discussed ordering an MRI but he would like to try meloxicam 15 mg a day for 3 weeks first.  If not improving at that point we will proceed with an MRI of the lumbar spine.

## 2021-03-05 LAB — CBC WITH DIFFERENTIAL/PLATELET
Absolute Monocytes: 706 cells/uL (ref 200–950)
Basophils Absolute: 72 cells/uL (ref 0–200)
Basophils Relative: 1 %
Eosinophils Absolute: 324 cells/uL (ref 15–500)
Eosinophils Relative: 4.5 %
HCT: 44.4 % (ref 38.5–50.0)
Hemoglobin: 15.2 g/dL (ref 13.2–17.1)
Lymphs Abs: 2390 cells/uL (ref 850–3900)
MCH: 31 pg (ref 27.0–33.0)
MCHC: 34.2 g/dL (ref 32.0–36.0)
MCV: 90.4 fL (ref 80.0–100.0)
MPV: 10 fL (ref 7.5–12.5)
Monocytes Relative: 9.8 %
Neutro Abs: 3708 cells/uL (ref 1500–7800)
Neutrophils Relative %: 51.5 %
Platelets: 271 10*3/uL (ref 140–400)
RBC: 4.91 10*6/uL (ref 4.20–5.80)
RDW: 12.4 % (ref 11.0–15.0)
Total Lymphocyte: 33.2 %
WBC: 7.2 10*3/uL (ref 3.8–10.8)

## 2021-03-05 LAB — COMPLETE METABOLIC PANEL WITH GFR
AG Ratio: 1.9 (calc) (ref 1.0–2.5)
ALT: 24 U/L (ref 9–46)
AST: 33 U/L (ref 10–40)
Albumin: 4.3 g/dL (ref 3.6–5.1)
Alkaline phosphatase (APISO): 55 U/L (ref 36–130)
BUN/Creatinine Ratio: 8 (calc) (ref 6–22)
BUN: 11 mg/dL (ref 7–25)
CO2: 27 mmol/L (ref 20–32)
Calcium: 9.5 mg/dL (ref 8.6–10.3)
Chloride: 103 mmol/L (ref 98–110)
Creat: 1.3 mg/dL — ABNORMAL HIGH (ref 0.60–1.29)
Globulin: 2.3 g/dL (calc) (ref 1.9–3.7)
Glucose, Bld: 84 mg/dL (ref 65–99)
Potassium: 4.2 mmol/L (ref 3.5–5.3)
Sodium: 139 mmol/L (ref 135–146)
Total Bilirubin: 0.5 mg/dL (ref 0.2–1.2)
Total Protein: 6.6 g/dL (ref 6.1–8.1)
eGFR: 69 mL/min/{1.73_m2} (ref >=60)

## 2021-03-05 LAB — LIPID PANEL
Cholesterol: 178 mg/dL (ref <200)
HDL: 57 mg/dL (ref >=40)
LDL Cholesterol (Calc): 97 mg/dL (calc)
Non-HDL Cholesterol (Calc): 121 mg/dL (calc) (ref <130)
Total CHOL/HDL Ratio: 3.1 (calc) (ref <5.0)
Triglycerides: 140 mg/dL (ref <150)

## 2021-04-01 ENCOUNTER — Other Ambulatory Visit: Payer: Self-pay | Admitting: Family Medicine

## 2022-07-13 ENCOUNTER — Encounter: Payer: Self-pay | Admitting: Family Medicine

## 2022-07-13 ENCOUNTER — Ambulatory Visit: Payer: BC Managed Care – PPO | Admitting: Family Medicine

## 2022-07-13 VITALS — BP 120/80 | HR 60 | Temp 98.0°F | Ht 73.0 in | Wt 230.0 lb

## 2022-07-13 DIAGNOSIS — J069 Acute upper respiratory infection, unspecified: Secondary | ICD-10-CM

## 2022-07-13 NOTE — Patient Instructions (Signed)

## 2022-07-13 NOTE — Assessment & Plan Note (Signed)
Covid/Flu/RSV pending. Reassured patient that symptoms and exam findings are most consistent with a viral upper respiratory infection and explained lack of efficacy of antibiotics against viruses.  Discussed expected course and features suggestive of secondary bacterial infection.  Continue supportive care. Increase fluid intake with water or electrolyte solution like pedialyte. Encouraged acetaminophen as needed for fever/pain. Encouraged salt water gargling, chloraseptic spray and throat lozenges. Encouraged OTC guaifenesin. Encouraged saline sinus flushes and/or neti with humidified air.

## 2022-07-13 NOTE — Progress Notes (Signed)
Acute Office Visit  Subjective:     Patient ID: Trevor Chavez, male    DOB: 25-Aug-1975, 47 y.o.   MRN: [id number]  Chief Complaint  Patient presents with   Acute Visit    bad cold. Sx: bad hacking cough with phlegm with tinge of blood at times in morning; congestion. chest hurts when he coughs. Sx began Saturday. - JBG\\\    HPI Patient is in today for 4 days of cough, mucopurulent chest congestion, sinus congestion, night sweats. Denies fever, shortness of breath, wheezing, chest pain, N/V/D. He works for [employer] and is exposed, no home covid test. Has tried mucinex.  Review of Systems  All other systems reviewed and are negative.   Past Medical History:  Diagnosis Date   Closed displaced Maisonneuve fracture of left lower extremity 01/17/2019   Left fibular fracture    Past Surgical History:  Procedure Laterality Date   NO PAST SURGERIES     ORIF ANKLE FRACTURE Left 01/17/2019   Procedure: OPEN REDUCTION INTERNAL FIXATION (ORIF) BIMALLEOAR AND SYNDESMOSIS;  Surgeon: Marchia Bond, MD;  Location: [facility];  Service: Orthopedics;  Laterality: Left;   Current Outpatient Medications on File Prior to Visit  Medication Sig Dispense Refill   meloxicam (MOBIC) 15 MG tablet TAKE 1 TABLET (15 MG TOTAL) BY MOUTH DAILY. 30 tablet 0   Multiple Vitamin (MULTIVITAMIN WITH MINERALS) TABS tablet Take 1 tablet by mouth daily.     No current facility-administered medications on file prior to visit.   No Known Allergies      Objective:    BP 120/80   Pulse 60   Temp 98 F (36.7 C) (Oral)   Ht 6\' 1"  (1.854 m)   Wt 230 lb (104.3 kg)   SpO2 97%   BMI 30.34 kg/m    Physical Exam Vitals and nursing note reviewed.  Constitutional:      Appearance: Normal appearance. He is normal weight.  HENT:     Head: Normocephalic and atraumatic.     Right Ear: Tympanic membrane, ear canal and external ear normal.     Left Ear: Tympanic membrane, ear canal and external ear  normal.     Nose: Congestion present.     Mouth/Throat:     Mouth: Mucous membranes are moist.     Pharynx: Oropharynx is clear.  Eyes:     Conjunctiva/sclera: Conjunctivae normal.  Cardiovascular:     Rate and Rhythm: Normal rate and regular rhythm.     Pulses: Normal pulses.     Heart sounds: Normal heart sounds.  Pulmonary:     Effort: Pulmonary effort is normal.     Breath sounds: Normal breath sounds.  Musculoskeletal:     Cervical back: No tenderness.  Lymphadenopathy:     Cervical: No cervical adenopathy.  Skin:    General: Skin is warm and dry.     Capillary Refill: Capillary refill takes less than 2 seconds.  Neurological:     General: No focal deficit present.     Mental Status: He is alert and oriented to person, place, and time. Mental status is at baseline.  Psychiatric:        Mood and Affect: Mood normal.        Behavior: Behavior normal.        Thought Content: Thought content normal.        Judgment: Judgment normal.     No results found for any visits on 07/13/22.  Assessment & Plan:   Problem List Items Addressed This Visit       Respiratory   Viral URI with cough - Primary    Covid/Flu/RSV pending. Reassured patient that symptoms and exam findings are most consistent with a viral upper respiratory infection and explained lack of efficacy of antibiotics against viruses.  Discussed expected course and features suggestive of secondary bacterial infection.  Continue supportive care. Increase fluid intake with water or electrolyte solution like pedialyte. Encouraged acetaminophen as needed for fever/pain. Encouraged salt water gargling, chloraseptic spray and throat lozenges. Encouraged OTC guaifenesin. Encouraged saline sinus flushes and/or neti with humidified air.        Relevant Orders   SARS-CoV-2 RNA, Influenza A/B, and RSV RNA, Qualitative NAAT    No orders of the defined types were placed in this encounter.   Return if symptoms worsen or  fail to improve.  Rubie Maid, FNP

## 2022-07-14 LAB — SARS-COV-2 RNA, INFLUENZA A/B, AND RSV RNA, QUALITATIVE NAAT
INFLUENZA A RNA: NOT DETECTED
INFLUENZA B RNA: NOT DETECTED
RSV RNA: NOT DETECTED
SARS COV2 RNA: NOT DETECTED

## 2022-09-18 ENCOUNTER — Encounter: Payer: Self-pay | Admitting: Family Medicine

## 2022-09-18 ENCOUNTER — Ambulatory Visit: Payer: BC Managed Care – PPO | Admitting: Family Medicine

## 2022-09-18 VITALS — BP 120/72 | HR 72 | Temp 98.4°F | Ht 73.0 in | Wt 232.0 lb

## 2022-09-18 DIAGNOSIS — R053 Chronic cough: Secondary | ICD-10-CM | POA: Diagnosis not present

## 2022-09-18 MED ORDER — PREDNISONE 20 MG PO TABS: ORAL_TABLET | ORAL | 0 refills | Status: DC

## 2022-09-18 NOTE — Progress Notes (Signed)
Subjective:    Patient ID: Trevor Chavez, male    DOB: 05-Dec-1975, 47 y.o.   MRN: [id number]  Cough   Patient is a very pleasant 47 year old African-American gentleman who presents today with a nonproductive cough now for 2 months.  He states symptoms began after he had a virus back in early February.  He is waited that the cough will not go away.  He denies any chest pain although he does report some tightness in his airways and some soreness in his chest wall from coughing.  He denies any pleurisy.  He denies any sputum production.  He denies any hemoptysis.  He denies any shortness of angina.  He denies any fevers or chills.  He denies any sinus pain or postnasal drip.  He denies any weight loss or night sweats Past Medical History:  Diagnosis Date   Closed displaced Maisonneuve fracture of left lower extremity 01/17/2019   Left fibular fracture     Current Outpatient Medications on File Prior to Visit  Medication Sig Dispense Refill   meloxicam (MOBIC) 15 MG tablet TAKE 1 TABLET (15 MG TOTAL) BY MOUTH DAILY. 30 tablet 0   Multiple Vitamin (MULTIVITAMIN WITH MINERALS) TABS tablet Take 1 tablet by mouth daily.     No current facility-administered medications on file prior to visit.   No Known Allergies Social History   Socioeconomic History   Marital status: Married    Spouse name: Not on file   Number of children: Not on file   Years of education: Not on file   Highest education level: Associate degree: occupational, Scientist, product/process development, or vocational program  Occupational History   Not on file  Tobacco Use   Smoking status: Never   Smokeless tobacco: Never  Substance and Sexual Activity   Alcohol use: Yes    Alcohol/week: 0.0 standard drinks of alcohol    Comment: Occasional   Drug use: No   Sexual activity: Yes  Other Topics Concern   Not on file  Social History Narrative   Not on file   Social Determinants of Health   Financial Resource Strain: Low Risk  (09/17/2022)    Overall Financial Resource Strain (CARDIA)    Difficulty of Paying Living Expenses: Not hard at all  Food Insecurity: No Food Insecurity (09/17/2022)   Hunger Vital Sign    Worried About Running Out of Food in the Last Year: Never true    Ran Out of Food in the Last Year: Never true  Transportation Needs: No Transportation Needs (09/17/2022)   PRAPARE - Administrator, Civil Service (Medical): No    Lack of Transportation (Non-Medical): No  Physical Activity: Insufficiently Active (09/17/2022)   Exercise Vital Sign    Days of Exercise per Week: 4 days    Minutes of Exercise per Session: 10 min  Stress: No Stress Concern Present (09/17/2022)   [employer] of Occupational Health - Occupational Stress Questionnaire    Feeling of Stress : Not at all  Social Connections: Socially Integrated (09/17/2022)   Social Connection and Isolation Panel [NHANES]    Frequency of Communication with Friends and Family: More than three times a week    Frequency of Social Gatherings with Friends and Family: Once a week    Attends Religious Services: 1 to 4 times per year    Active Member of [employer] or Organizations: Yes    Attends Banker Meetings: 1 to 4 times per year    Marital Status:  Married  Catering manager Violence: Not on file   Family History  Problem Relation Age of Onset   Cancer Mother        lung   Hypertension Father    Mental illness Brother       Review of Systems  Respiratory:  Positive for cough.   All other systems reviewed and are negative.      Objective:   Physical Exam Vitals reviewed.  Constitutional:      General: He is not in acute distress.    Appearance: He is well-developed. He is not diaphoretic.  HENT:     Head: Normocephalic and atraumatic.     Right Ear: External ear normal.     Left Ear: External ear normal.     Nose: Nose normal.     Mouth/Throat:     Pharynx: No oropharyngeal exudate.  Eyes:     General: No scleral  icterus.       Right eye: No discharge.        Left eye: No discharge.     Conjunctiva/sclera: Conjunctivae normal.     Pupils: Pupils are equal, round, and reactive to light.  Neck:     Thyroid: No thyromegaly.     Vascular: No JVD.     Trachea: No tracheal deviation.  Cardiovascular:     Rate and Rhythm: Normal rate and regular rhythm.     Heart sounds: Normal heart sounds. No murmur heard.    No friction rub. No gallop.  Pulmonary:     Effort: Pulmonary effort is normal. No respiratory distress.     Breath sounds: Normal breath sounds. No stridor. No wheezing or rales.  Chest:     Chest wall: No tenderness.  Abdominal:     General: Bowel sounds are normal. There is no distension.     Palpations: Abdomen is soft. There is no mass.     Tenderness: There is no abdominal tenderness. There is no guarding or rebound.     Hernia: There is no hernia in the left inguinal area.  Genitourinary:    Penis: Normal and circumcised.      Testes: Normal.        Right: Mass or tenderness not present.        Left: Mass or tenderness not present.     Prostate: Normal.     Rectum: Normal.  Musculoskeletal:        General: Normal range of motion.     Cervical back: Normal range of motion and neck supple.  Lymphadenopathy:     Cervical: No cervical adenopathy.     Lower Body: No right inguinal adenopathy. No left inguinal adenopathy.  Skin:    General: Skin is warm.     Coloration: Skin is not pale.     Findings: No erythema or rash.  Neurological:     Mental Status: He is alert and oriented to person, place, and time.     Cranial Nerves: No cranial nerve deficit.     Motor: No abnormal muscle tone.     Coordination: Coordination normal.     Deep Tendon Reflexes: Reflexes are normal and symmetric.  Psychiatric:        Behavior: Behavior normal.        Thought Content: Thought content normal.        Judgment: Judgment normal.           Assessment & Plan:  Chronic cough - Plan: DG  Chest 2 View,  predniSONE (DELTASONE) 20 MG tablet  Honestly the patient's exam today is completely normal.  I suspect chronic bronchitis due to a virus.  I will try the patient empirically on a prednisone taper pack as an anti-inflammatory to see if this will calm some of the irritation of the airways.  Obtain chest x-ray.  If not improving, to treat possible laryngoesophageal reflux

## 2023-08-24 ENCOUNTER — Other Ambulatory Visit: Payer: Self-pay | Admitting: Orthopedic Surgery

## 2023-09-01 NOTE — Pre-Procedure Instructions (Signed)
 Surgical Instructions   Your procedure is scheduled on September 15, 2023. Report to [facility] Main Entrance "A" at 8:30 A.M., then check in with the Admitting office. Any questions or running late day of surgery: call [phone]  Questions prior to your surgery date: call [phone], Monday-Friday, 8am-4pm. If you experience any cold or flu symptoms such as cough, fever, chills, shortness of breath, etc. between now and your scheduled surgery, please notify us at the above number.     Remember:  Do not eat after midnight the night before your surgery  You may drink clear liquids until 8:30 AM the morning of your surgery.   Clear liquids allowed are: Water, Non-Citrus Juices (without pulp), Carbonated Beverages, Clear Tea (no milk, honey, etc.), Black Coffee Only (NO MILK, CREAM OR POWDERED CREAMER of any kind), and Gatorade.  Patient Instructions  The night before surgery:  No food after midnight. ONLY clear liquids after midnight  The day of surgery (if you do NOT have diabetes):  Drink ONE (1) Pre-Surgery Clear Ensure by 8:30 AM the morning of surgery. Drink in one sitting. Do not sip.  This drink was given to you during your hospital  pre-op appointment visit.  Nothing else to drink after completing the  Pre-Surgery Clear Ensure.         If you have questions, please contact your surgeon's office.   Take these medicines the morning of surgery with A SIP OF WATER: NONE   May take these medicines IF NEEDED: acetaminophen (TYLENOL)  HYDROcodone-acetaminophen (NORCO/VICODIN)    One week prior to surgery, STOP taking any Aspirin (unless otherwise instructed by your surgeon) Aleve, Naproxen, Ibuprofen, Motrin, Advil, Goody's, BC's, all herbal medications, fish oil, and non-prescription vitamins.                     Do NOT Smoke (Tobacco/Vaping) for 24 hours prior to your procedure.  If you use a CPAP at night, you may bring your mask/headgear for your overnight stay.    You will be asked to remove any contacts, glasses, piercing's, hearing aid's, dentures/partials prior to surgery. Please bring cases for these items if needed.    Patients discharged the day of surgery will not be allowed to drive home, and someone needs to stay with them for 24 hours.  SURGICAL WAITING ROOM VISITATION Patients may have no more than 2 support people in the waiting area - these visitors may rotate.   Pre-op nurse will coordinate an appropriate time for 1 ADULT support person, who may not rotate, to accompany patient in pre-op.  Children under the age of 30 must have an adult with them who is not the patient and must remain in the main waiting area with an adult.  If the patient needs to stay at the hospital during part of their recovery, the visitor guidelines for inpatient rooms apply.  Please refer to the [facility] website for the visitor guidelines for any additional information.   If you received a COVID test during your pre-op visit  it is requested that you wear a mask when out in public, stay away from anyone that may not be feeling well and notify your surgeon if you develop symptoms. If you have been in contact with anyone that has tested positive in the last 10 days please notify you surgeon.      Pre-operative 5 CHG Bathing Instructions   You can play a key role in reducing the risk of infection after  surgery. Your skin needs to be as free of germs as possible. You can reduce the number of germs on your skin by washing with CHG (chlorhexidine gluconate) soap before surgery. CHG is an antiseptic soap that kills germs and continues to kill germs even after washing.   DO NOT use if you have an allergy to chlorhexidine/CHG or antibacterial soaps. If your skin becomes reddened or irritated, stop using the CHG and notify one of our RNs at [phone].   Please shower with the CHG soap starting 4 days before surgery using the following schedule:     Please keep  in mind the following:  DO NOT shave, including legs and underarms, starting the day of your first shower.   You may shave your face at any point before/day of surgery.  Place clean sheets on your bed the day you start using CHG soap. Use a clean washcloth (not used since being washed) for each shower. DO NOT sleep with pets once you start using the CHG.   CHG Shower Instructions:  Wash your face and private area with normal soap. If you choose to wash your hair, wash first with your normal shampoo.  After you use shampoo/soap, rinse your hair and body thoroughly to remove shampoo/soap residue.  Turn the water OFF and apply about 3 tablespoons (45 ml) of CHG soap to a CLEAN washcloth.  Apply CHG soap ONLY FROM YOUR NECK DOWN TO YOUR TOES (washing for 3-5 minutes)  DO NOT use CHG soap on face, private areas, open wounds, or sores.  Pay special attention to the area where your surgery is being performed.  If you are having back surgery, having someone wash your back for you may be helpful. Wait 2 minutes after CHG soap is applied, then you may rinse off the CHG soap.  Pat dry with a clean towel  Put on clean clothes/pajamas   If you choose to wear lotion, please use ONLY the CHG-compatible lotions that are listed below.  Additional instructions for the day of surgery: DO NOT APPLY any lotions, deodorants, cologne, or perfumes.   Do not bring valuables to the hospital. [facility] is not responsible for any belongings/valuables. Do not wear nail polish, gel polish, artificial nails, or any other type of covering on natural nails (fingers and toes) Do not wear jewelry or makeup Put on clean/comfortable clothes.  Please brush your teeth.  Ask your nurse before applying any prescription medications to the skin.     CHG Compatible Lotions   Aveeno Moisturizing lotion  Cetaphil Moisturizing Cream  Cetaphil Moisturizing Lotion  Clairol Herbal Essence Moisturizing Lotion, Dry Skin   Clairol Herbal Essence Moisturizing Lotion, Extra Dry Skin  Clairol Herbal Essence Moisturizing Lotion, Normal Skin  Curel Age Defying Therapeutic Moisturizing Lotion with Alpha Hydroxy  Curel Extreme Care Body Lotion  Curel Soothing Hands Moisturizing Hand Lotion  Curel Therapeutic Moisturizing Cream, Fragrance-Free  Curel Therapeutic Moisturizing Lotion, Fragrance-Free  Curel Therapeutic Moisturizing Lotion, Original Formula  Eucerin Daily Replenishing Lotion  Eucerin Dry Skin Therapy Plus Alpha Hydroxy Crme  Eucerin Dry Skin Therapy Plus Alpha Hydroxy Lotion  Eucerin Original Crme  Eucerin Original Lotion  Eucerin Plus Crme Eucerin Plus Lotion  Eucerin TriLipid Replenishing Lotion  Keri Anti-Bacterial Hand Lotion  Keri Deep Conditioning Original Lotion Dry Skin Formula Softly Scented  Keri Deep Conditioning Original Lotion, Fragrance Free Sensitive Skin Formula  Keri Lotion Fast Absorbing Fragrance Free Sensitive Skin Formula  Keri Lotion Fast Absorbing Softly Scented Dry  Skin Formula  Keri Original Lotion  Keri Skin Renewal Lotion Keri Silky Smooth Lotion  Keri Silky Smooth Sensitive Skin Lotion  Nivea Body Creamy Conditioning Oil  Nivea Body Extra Enriched Lotion  Nivea Body Original Lotion  Nivea Body Sheer Moisturizing Lotion Nivea Crme  Nivea Skin Firming Lotion  NutraDerm 30 Skin Lotion  NutraDerm Skin Lotion  NutraDerm Therapeutic Skin Cream  NutraDerm Therapeutic Skin Lotion  ProShield Protective Hand Cream  Provon moisturizing lotion  Please read over the following fact sheets that you were given.

## 2023-09-02 ENCOUNTER — Other Ambulatory Visit: Payer: Self-pay

## 2023-09-02 ENCOUNTER — Encounter (HOSPITAL_COMMUNITY): Payer: Self-pay

## 2023-09-02 ENCOUNTER — Encounter (HOSPITAL_COMMUNITY)
Admission: RE | Admit: 2023-09-02 | Discharge: 2023-09-02 | Disposition: A | Discharge status: Home / Self Care | Source: Ambulatory Visit | Attending: Orthopedic Surgery | Admitting: Orthopedic Surgery

## 2023-09-02 VITALS — BP 142/84 | HR 76 | Temp 98.2°F | Resp 18 | Ht 72.0 in | Wt 217.3 lb

## 2023-09-02 DIAGNOSIS — Z01812 Encounter for preprocedural laboratory examination: Secondary | ICD-10-CM | POA: Insufficient documentation

## 2023-09-02 DIAGNOSIS — Z01818 Encounter for other preprocedural examination: Secondary | ICD-10-CM

## 2023-09-02 HISTORY — DX: Unspecified osteoarthritis, unspecified site: M19.90

## 2023-09-02 LAB — CBC
HCT: 44 % (ref 39.0–52.0)
Hemoglobin: 15.1 g/dL (ref 13.0–17.0)
MCH: 31.1 pg (ref 26.0–34.0)
MCHC: 34.3 g/dL (ref 30.0–36.0)
MCV: 90.7 fL (ref 80.0–100.0)
Platelets: 234 10*3/uL (ref 150–400)
RBC: 4.85 MIL/uL (ref 4.22–5.81)
RDW: 11.9 % (ref 11.5–15.5)
WBC: 8.3 10*3/uL (ref 4.0–10.5)
nRBC: 0 % (ref 0.0–0.2)

## 2023-09-02 LAB — SURGICAL PCR SCREEN
MRSA, PCR: NEGATIVE
Staphylococcus aureus: NEGATIVE

## 2023-09-02 NOTE — Progress Notes (Signed)
 PCP - Dr. Lynnea Ferrier Cardiologist - Denies  PPM/ICD - Denies Device Orders - n/a Rep Notified - n/a  Chest x-ray - n/a EKG - Denies Stress Test - Denies ECHO - Denies Cardiac Cath - Denies  Sleep Study - Denies CPAP - n/a  No DM  Last dose of GLP1 agonist- n/a GLP1 instructions: n/a  Blood Thinner Instructions: n/a Aspirin Instructions: n/a  ERAS Protcol - Clear liquids until 0830 morning of surgery PRE-SURGERY Ensure or G2- Ensure given to pt with instructions  COVID TEST- n/a   Anesthesia review: No.   Patient denies shortness of breath, fever, cough and chest pain at PAT appointment. Pt denies any respiratory illness/infection in the last two months.   All instructions explained to the patient, with a verbal understanding of the material. Patient agrees to go over the instructions while at home for a better understanding. Patient also instructed to self quarantine after being tested for COVID-19. The opportunity to ask questions was provided.

## 2023-09-15 ENCOUNTER — Ambulatory Visit (HOSPITAL_COMMUNITY)
Admission: RE | Admit: 2023-09-15 | Discharge: 2023-09-15 | Disposition: A | Discharge status: Home / Self Care | Attending: Orthopedic Surgery | Admitting: Orthopedic Surgery

## 2023-09-15 ENCOUNTER — Ambulatory Visit (HOSPITAL_COMMUNITY): Payer: Self-pay | Admitting: Anesthesiology

## 2023-09-15 ENCOUNTER — Ambulatory Visit (HOSPITAL_COMMUNITY)

## 2023-09-15 ENCOUNTER — Other Ambulatory Visit: Payer: Self-pay

## 2023-09-15 ENCOUNTER — Other Ambulatory Visit (HOSPITAL_COMMUNITY): Payer: Self-pay

## 2023-09-15 ENCOUNTER — Encounter (HOSPITAL_COMMUNITY)
Admission: RE | Disposition: A | Discharge status: Home / Self Care | Payer: Self-pay | Source: Home / Self Care | Attending: Orthopedic Surgery

## 2023-09-15 ENCOUNTER — Encounter (HOSPITAL_COMMUNITY): Payer: Self-pay | Admitting: Orthopedic Surgery

## 2023-09-15 DIAGNOSIS — M1990 Unspecified osteoarthritis, unspecified site: Secondary | ICD-10-CM | POA: Diagnosis not present

## 2023-09-15 DIAGNOSIS — M5116 Intervertebral disc disorders with radiculopathy, lumbar region: Secondary | ICD-10-CM | POA: Insufficient documentation

## 2023-09-15 HISTORY — PX: LUMBAR LAMINECTOMY/DECOMPRESSION MICRODISCECTOMY: SHX5026

## 2023-09-15 SURGERY — LUMBAR LAMINECTOMY/DECOMPRESSION MICRODISCECTOMY 1 LEVEL
Anesthesia: General | Laterality: Left

## 2023-09-15 MED ORDER — ACETAMINOPHEN 500 MG PO TABS
1000.0000 mg | ORAL_TABLET | Freq: Once | ORAL | Status: AC
  Administered 2023-09-15: 1000 mg via ORAL
  Filled 2023-09-15: qty 2

## 2023-09-15 MED ORDER — EPHEDRINE SULFATE-NACL 50-0.9 MG/10ML-% IV SOSY
PREFILLED_SYRINGE | INTRAVENOUS | Status: DC | PRN
  Administered 2023-09-15 (×2): 5 mg via INTRAVENOUS

## 2023-09-15 MED ORDER — METHYLPREDNISOLONE ACETATE 40 MG/ML IJ SUSP
INTRAMUSCULAR | Status: AC
  Filled 2023-09-15: qty 1

## 2023-09-15 MED ORDER — FENTANYL CITRATE (PF) 250 MCG/5ML IJ SOLN
INTRAMUSCULAR | Status: AC
Start: 2023-09-15 — End: ?
  Filled 2023-09-15: qty 5

## 2023-09-15 MED ORDER — FENTANYL CITRATE (PF) 100 MCG/2ML IJ SOLN
INTRAMUSCULAR | Status: AC
Start: 2023-09-15 — End: 2023-09-15
  Filled 2023-09-15: qty 2

## 2023-09-15 MED ORDER — ONDANSETRON HCL 4 MG/2ML IJ SOLN
INTRAMUSCULAR | Status: DC | PRN
  Administered 2023-09-15: 4 mg via INTRAVENOUS

## 2023-09-15 MED ORDER — 0.9 % SODIUM CHLORIDE (POUR BTL) OPTIME
TOPICAL | Status: DC | PRN
  Administered 2023-09-15: 1000 mL

## 2023-09-15 MED ORDER — FENTANYL CITRATE (PF) 100 MCG/2ML IJ SOLN
25.0000 ug | INTRAMUSCULAR | Status: DC | PRN
  Administered 2023-09-15 (×2): 50 ug via INTRAVENOUS

## 2023-09-15 MED ORDER — THROMBIN 20000 UNITS EX SOLR: CUTANEOUS | Status: DC | PRN

## 2023-09-15 MED ORDER — HYDROCODONE-ACETAMINOPHEN 5-325 MG PO TABS
1.0000 | ORAL_TABLET | Freq: Four times a day (QID) | ORAL | 0 refills | Status: AC | PRN
  Filled 2023-09-15: qty 30, 4d supply, fill #0

## 2023-09-15 MED ORDER — HEMOSTATIC AGENTS (NO CHARGE) OPTIME
TOPICAL | Status: DC | PRN
  Administered 2023-09-15: 1 via TOPICAL

## 2023-09-15 MED ORDER — FENTANYL CITRATE (PF) 250 MCG/5ML IJ SOLN
INTRAMUSCULAR | Status: DC | PRN
  Administered 2023-09-15 (×2): 50 ug via INTRAVENOUS

## 2023-09-15 MED ORDER — MIDAZOLAM HCL 2 MG/2ML IJ SOLN
INTRAMUSCULAR | Status: AC
  Filled 2023-09-15: qty 2

## 2023-09-15 MED ORDER — BUPIVACAINE-EPINEPHRINE 0.25% -1:200000 IJ SOLN
INTRAMUSCULAR | Status: DC | PRN
  Administered 2023-09-15: 20 mL
  Administered 2023-09-15: 8 mL

## 2023-09-15 MED ORDER — ONDANSETRON HCL 4 MG/2ML IJ SOLN: 4.0000 mg | Freq: Once | INTRAMUSCULAR | Status: DC | PRN

## 2023-09-15 MED ORDER — DEXAMETHASONE SODIUM PHOSPHATE 10 MG/ML IJ SOLN
INTRAMUSCULAR | Status: DC | PRN
  Administered 2023-09-15: 10 mg via INTRAVENOUS

## 2023-09-15 MED ORDER — METHOCARBAMOL 500 MG PO TABS
500.0000 mg | ORAL_TABLET | Freq: Four times a day (QID) | ORAL | 0 refills | Status: AC
  Filled 2023-09-15: qty 60, 8d supply, fill #0

## 2023-09-15 MED ORDER — LIDOCAINE 2% (20 MG/ML) 5 ML SYRINGE
INTRAMUSCULAR | Status: DC | PRN
  Administered 2023-09-15: 100 mg via INTRAVENOUS

## 2023-09-15 MED ORDER — ROCURONIUM BROMIDE 10 MG/ML (PF) SYRINGE
PREFILLED_SYRINGE | INTRAVENOUS | Status: DC | PRN
  Administered 2023-09-15: 20 mg via INTRAVENOUS
  Administered 2023-09-15: 80 mg via INTRAVENOUS

## 2023-09-15 MED ORDER — BUPIVACAINE LIPOSOME 1.3 % IJ SUSP
INTRAMUSCULAR | Status: AC
  Filled 2023-09-15: qty 20

## 2023-09-15 MED ORDER — CEFAZOLIN SODIUM-DEXTROSE 2-4 GM/100ML-% IV SOLN
2.0000 g | INTRAVENOUS | Status: AC
  Administered 2023-09-15: 2 g via INTRAVENOUS
  Filled 2023-09-15: qty 100

## 2023-09-15 MED ORDER — ORAL CARE MOUTH RINSE: 15.0000 mL | Freq: Once | OROMUCOSAL | Status: AC

## 2023-09-15 MED ORDER — MEPERIDINE HCL 25 MG/ML IJ SOLN: 6.2500 mg | INTRAMUSCULAR | Status: DC | PRN

## 2023-09-15 MED ORDER — LACTATED RINGERS IV SOLN: INTRAVENOUS | Status: DC

## 2023-09-15 MED ORDER — CHLORHEXIDINE GLUCONATE 0.12 % MT SOLN
15.0000 mL | Freq: Once | OROMUCOSAL | Status: AC
  Administered 2023-09-15: 15 mL via OROMUCOSAL
  Filled 2023-09-15: qty 15

## 2023-09-15 MED ORDER — OXYCODONE HCL 5 MG PO TABS: 5.0000 mg | ORAL_TABLET | Freq: Once | ORAL | Status: DC | PRN

## 2023-09-15 MED ORDER — POVIDONE-IODINE 7.5 % EX SOLN
Freq: Once | CUTANEOUS | Status: DC
  Filled 2023-09-15: qty 118

## 2023-09-15 MED ORDER — BUPIVACAINE LIPOSOME 1.3 % IJ SUSP
INTRAMUSCULAR | Status: DC | PRN
  Administered 2023-09-15: 20 mL

## 2023-09-15 MED ORDER — THROMBIN 20000 UNITS EX SOLR
CUTANEOUS | Status: AC
  Filled 2023-09-15: qty 20000

## 2023-09-15 MED ORDER — MIDAZOLAM HCL 2 MG/2ML IJ SOLN
INTRAMUSCULAR | Status: DC | PRN
  Administered 2023-09-15: 2 mg via INTRAVENOUS

## 2023-09-15 MED ORDER — PHENYLEPHRINE 80 MCG/ML (10ML) SYRINGE FOR IV PUSH (FOR BLOOD PRESSURE SUPPORT)
PREFILLED_SYRINGE | INTRAVENOUS | Status: DC | PRN
  Administered 2023-09-15 (×7): 80 ug via INTRAVENOUS

## 2023-09-15 MED ORDER — BUPIVACAINE-EPINEPHRINE (PF) 0.25% -1:200000 IJ SOLN
INTRAMUSCULAR | Status: AC
  Filled 2023-09-15: qty 30

## 2023-09-15 MED ORDER — PROPOFOL 10 MG/ML IV BOLUS
INTRAVENOUS | Status: DC | PRN
  Administered 2023-09-15: 200 mg via INTRAVENOUS

## 2023-09-15 MED ORDER — OXYCODONE HCL 5 MG/5ML PO SOLN: 5.0000 mg | Freq: Once | ORAL | Status: DC | PRN

## 2023-09-15 MED ORDER — SUGAMMADEX SODIUM 200 MG/2ML IV SOLN
INTRAVENOUS | Status: DC | PRN
  Administered 2023-09-15: 195 mg via INTRAVENOUS

## 2023-09-15 SURGICAL SUPPLY — 61 items
BAG COUNTER SPONGE SURGICOUNT (BAG) ×1 IMPLANT
BENZOIN TINCTURE PRP APPL 2/3 (GAUZE/BANDAGES/DRESSINGS) IMPLANT
BNDG GAUZE DERMACEA FLUFF 4 (GAUZE/BANDAGES/DRESSINGS) IMPLANT
BUR ROUND PRECISION 4.0 (BURR) ×1 IMPLANT
CABLE BIPOLOR RESECTION CORD (MISCELLANEOUS) ×1 IMPLANT
CANISTER SUCT 3000ML PPV (MISCELLANEOUS) ×1 IMPLANT
COVER SURGICAL LIGHT HANDLE (MISCELLANEOUS) ×1 IMPLANT
DRAIN CHANNEL 15F RND FF W/TCR (WOUND CARE) IMPLANT
DRAPE POUCH INSTRU U-SHP 10X18 (DRAPES) ×2 IMPLANT
DRAPE SURG 17X23 STRL (DRAPES) ×4 IMPLANT
DURAPREP 26ML APPLICATOR (WOUND CARE) ×1 IMPLANT
ELECT BLADE 4.0 EZ CLEAN MEGAD (MISCELLANEOUS) ×1 IMPLANT
ELECT CAUTERY BLADE 6.4 (BLADE) ×1 IMPLANT
ELECT REM PT RETURN 9FT ADLT (ELECTROSURGICAL) ×1 IMPLANT
ELECTRODE BLDE 4.0 EZ CLN MEGD (MISCELLANEOUS) IMPLANT
ELECTRODE REM PT RTRN 9FT ADLT (ELECTROSURGICAL) ×1 IMPLANT
EVACUATOR SILICONE 100CC (DRAIN) IMPLANT
FILTER STRAW FLUID ASPIR (MISCELLANEOUS) ×1 IMPLANT
GAUZE 4X4 16PLY ~~LOC~~+RFID DBL (SPONGE) ×2 IMPLANT
GAUZE SPONGE 4X4 12PLY STRL (GAUZE/BANDAGES/DRESSINGS) ×1 IMPLANT
GLOVE BIO SURGEON STRL SZ 6.5 (GLOVE) ×1 IMPLANT
GLOVE BIO SURGEON STRL SZ8 (GLOVE) ×1 IMPLANT
GLOVE BIOGEL PI IND STRL 7.0 (GLOVE) ×1 IMPLANT
GLOVE BIOGEL PI IND STRL 8 (GLOVE) ×1 IMPLANT
GLOVE SURG ENC MOIS LTX SZ6.5 (GLOVE) ×1 IMPLANT
GOWN STRL REUS W/ TWL LRG LVL3 (GOWN DISPOSABLE) ×1 IMPLANT
GOWN STRL REUS W/ TWL XL LVL3 (GOWN DISPOSABLE) ×2 IMPLANT
IV CATH 14GX2 1/4 (CATHETERS) ×1 IMPLANT
KIT BASIN OR (CUSTOM PROCEDURE TRAY) ×1 IMPLANT
KIT POSITION SURG JACKSON T1 (MISCELLANEOUS) ×1 IMPLANT
KIT TURNOVER KIT B (KITS) ×1 IMPLANT
NDL 18GX1X1/2 (RX/OR ONLY) (NEEDLE) ×1 IMPLANT
NDL 22X1.5 STRL (OR ONLY) (MISCELLANEOUS) ×1 IMPLANT
NDL HYPO 25GX1X1/2 BEV (NEEDLE) ×1 IMPLANT
NDL SPNL 18GX3.5 QUINCKE PK (NEEDLE) ×2 IMPLANT
NEEDLE 18GX1X1/2 (RX/OR ONLY) (NEEDLE) ×1 IMPLANT
NEEDLE 22X1.5 STRL (OR ONLY) (MISCELLANEOUS) ×1 IMPLANT
NEEDLE HYPO 25GX1X1/2 BEV (NEEDLE) ×1 IMPLANT
NEEDLE SPNL 18GX3.5 QUINCKE PK (NEEDLE) ×2 IMPLANT
NS IRRIG 1000ML POUR BTL (IV SOLUTION) ×1 IMPLANT
PACK LAMINECTOMY ORTHO (CUSTOM PROCEDURE TRAY) ×1 IMPLANT
PACK UNIVERSAL I (CUSTOM PROCEDURE TRAY) ×1 IMPLANT
PAD ARMBOARD POSITIONER FOAM (MISCELLANEOUS) ×2 IMPLANT
PATTIES SURGICAL .5 X.5 (GAUZE/BANDAGES/DRESSINGS) IMPLANT
PATTIES SURGICAL .5 X1 (DISPOSABLE) ×1 IMPLANT
SPONGE INTESTINAL PEANUT (DISPOSABLE) ×1 IMPLANT
SPONGE SURGIFOAM ABS GEL SZ50 (HEMOSTASIS) ×1 IMPLANT
STRIP CLOSURE SKIN 1/2X4 (GAUZE/BANDAGES/DRESSINGS) IMPLANT
SURGIFLO W/THROMBIN 8M KIT (HEMOSTASIS) IMPLANT
SUT MNCRL AB 4-0 PS2 18 (SUTURE) ×1 IMPLANT
SUT VIC AB 0 CT1 18XCR BRD 8 (SUTURE) IMPLANT
SUT VIC AB 1 CT1 18XCR BRD 8 (SUTURE) ×1 IMPLANT
SUT VIC AB 2-0 CT2 18 VCP726D (SUTURE) ×1 IMPLANT
SYR 20ML LL LF (SYRINGE) IMPLANT
SYR BULB IRRIG 60ML STRL (SYRINGE) ×1 IMPLANT
SYR CONTROL 10ML LL (SYRINGE) ×2 IMPLANT
SYR TB 1ML LUER SLIP (SYRINGE) ×4 IMPLANT
TOWEL GREEN STERILE (TOWEL DISPOSABLE) ×1 IMPLANT
TOWEL GREEN STERILE FF (TOWEL DISPOSABLE) ×1 IMPLANT
WATER STERILE IRR 1000ML POUR (IV SOLUTION) ×1 IMPLANT
YANKAUER SUCT BULB TIP NO VENT (SUCTIONS) ×1 IMPLANT

## 2023-09-15 NOTE — Anesthesia Preprocedure Evaluation (Addendum)
 Anesthesia Evaluation  Patient identified by MRN, date of birth, ID band Patient awake    Reviewed: Allergy & Precautions, NPO status , Patient's Chart, lab work & pertinent test results  Airway Mallampati: II  TM Distance: >3 FB Neck ROM: Full    Dental  (+) Teeth Intact, Dental Advisory Given   Pulmonary neg pulmonary ROS   Pulmonary exam normal breath sounds clear to auscultation       Cardiovascular negative cardio ROS Normal cardiovascular exam Rhythm:Regular Rate:Normal     Neuro/Psych negative neurological ROS     GI/Hepatic negative GI ROS, Neg liver ROS,,,  Endo/Other  negative endocrine ROS    Renal/GU negative Renal ROS     Musculoskeletal  (+) Arthritis ,  LEFT ANKLE FRACTURE   Abdominal   Peds  Hematology negative hematology ROS (+)   Anesthesia Other Findings Day of surgery medications reviewed with the patient.  Reproductive/Obstetrics                             Anesthesia Physical Anesthesia Plan  ASA: 1  Anesthesia Plan: General   Post-op Pain Management: Tylenol PO (pre-op)* and Dilaudid IV   Induction: Intravenous  PONV Risk Score and Plan: 3 and Midazolam, Dexamethasone and Ondansetron  Airway Management Planned: Oral ETT  Additional Equipment: None  Intra-op Plan:   Post-operative Plan: Extubation in OR  Informed Consent: I have reviewed the patients History and Physical, chart, labs and discussed the procedure including the risks, benefits and alternatives for the proposed anesthesia with the patient or authorized representative who has indicated his/her understanding and acceptance.     Dental advisory given  Plan Discussed with: CRNA and Anesthesiologist  Anesthesia Plan Comments:         Anesthesia Quick Evaluation

## 2023-09-15 NOTE — Transfer of Care (Signed)
 Immediate Anesthesia Transfer of Care Note  Patient: Trevor Chavez  Procedure(s) Performed: LUMBAR LAMINECTOMY/DECOMPRESSION MICRODISCECTOMY 1 LEVEL (Left)  Patient Location: PACU  Anesthesia Type:General  Level of Consciousness: drowsy  Airway & Oxygen Therapy: Patient Spontanous Breathing  Post-op Assessment: Post -op Vital signs reviewed and stable  Post vital signs: stable  Last Vitals:  Vitals Value Taken Time  BP 140/76 09/15/23 1430  Temp    Pulse 85 09/15/23 1432  Resp 17 09/15/23 1432  SpO2 100 % 09/15/23 1432  Vitals shown include unfiled device data.  Last Pain:  Vitals:   09/15/23 0932  TempSrc:   PainSc: 3       Patients Stated Pain Goal: 3 (09/15/23 0931)  Complications: No notable events documented.

## 2023-09-15 NOTE — Anesthesia Procedure Notes (Signed)
 Procedure Name: Intubation Date/Time: 09/15/2023 12:10 PM  Performed by: Hessie Diener, CRNAPre-anesthesia Checklist: Patient identified, Emergency Drugs available, Suction available and Patient being monitored Patient Re-evaluated:Patient Re-evaluated prior to induction Oxygen Delivery Method: Circle system utilized Preoxygenation: Pre-oxygenation with 100% oxygen Induction Type: IV induction Ventilation: Mask ventilation without difficulty Laryngoscope Size: Mac and 3 Grade View: Grade II Tube type: Oral Tube size: 7.5 mm Number of attempts: 1 Airway Equipment and Method: Stylet and Oral airway Placement Confirmation: ETT inserted through vocal cords under direct vision, positive ETCO2 and breath sounds checked- equal and bilateral Tube secured with: Tape Dental Injury: Teeth and Oropharynx as per pre-operative assessment

## 2023-09-15 NOTE — H&P (Signed)
 PREOPERATIVE H&P  Chief Complaint: Left leg pain  HPI: AZAIAH LICCIARDI is a 48 y.o. 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  Patient has failed multiple forms of conservative care and continues to have pain (see office notes for additional details regarding the patient's full course of treatment)  Past Medical History:  Diagnosis Date   Arthritis    Right Knee   Closed displaced Maisonneuve fracture of left lower extremity 01/17/2019   Left fibular fracture    Past Surgical History:  Procedure Laterality Date   ORIF ANKLE FRACTURE Left 01/17/2019   Procedure: OPEN REDUCTION INTERNAL FIXATION (ORIF) BIMALLEOAR AND SYNDESMOSIS;  Surgeon: Teryl Lucy, MD;  Location: [facility];  Service: Orthopedics;  Laterality: Left;   VASECTOMY  2017   Social History   Socioeconomic History   Marital status: Married    Spouse name: Not on file   Number of children: Not on file   Years of education: Not on file   Highest education level: Associate degree: occupational, Scientist, product/process development, or vocational program  Occupational History   Not on file  Tobacco Use   Smoking status: Never   Smokeless tobacco: Never  Vaping Use   Vaping status: Never Used  Substance and Sexual Activity   Alcohol use: Yes    Alcohol/week: 0.0 standard drinks of alcohol    Comment: Occasional   Drug use: No   Sexual activity: Yes  Other Topics Concern   Not on file  Social History Narrative   Not on file   Social Drivers of Health   Financial Resource Strain: Low Risk  (09/17/2022)   Overall Financial Resource Strain (CARDIA)    Difficulty of Paying Living Expenses: Not hard at all  Food Insecurity: No Food Insecurity (09/17/2022)   Hunger Vital Sign    Worried About Running Out of Food in the Last Year: Never true    Ran Out of  Food in the Last Year: Never true  Transportation Needs: No Transportation Needs (09/17/2022)   PRAPARE - Administrator, Civil Service (Medical): No    Lack of Transportation (Non-Medical): No  Physical Activity: Insufficiently Active (09/17/2022)   Exercise Vital Sign    Days of Exercise per Week: 4 days    Minutes of Exercise per Session: 10 min  Stress: No Stress Concern Present (09/17/2022)   [employer] of Occupational Health - Occupational Stress Questionnaire    Feeling of Stress : Not at all  Social Connections: Socially Integrated (09/17/2022)   Social Connection and Isolation Panel [NHANES]    Frequency of Communication with Friends and Family: More than three times a week    Frequency of Social Gatherings with Friends and Family: Once a week    Attends Religious Services: 1 to 4 times per year    Active Member of [employer] or Organizations: Yes    Attends Banker Meetings: 1 to 4 times per year    Marital Status: Married   Family History  Problem Relation Age of Onset   Cancer Mother        lung   Hypertension Father    Mental illness Brother    No Known Allergies Prior to Admission medications   Medication Sig Start Date End Date Taking? Authorizing Provider  acetaminophen (TYLENOL) 500 MG tablet Take  500 mg by mouth every 6 (six) hours as needed for moderate pain (pain score 4-6).   Yes [provider]  HYDROcodone-acetaminophen (NORCO/VICODIN) 5-325 MG tablet Take 1 tablet by mouth every 6 (six) hours as needed for moderate pain (pain score 4-6). 08/20/23  Yes [provider]  meloxicam (MOBIC) 15 MG tablet TAKE 1 TABLET (15 MG TOTAL) BY MOUTH DAILY. 04/01/21  Yes Donita Brooks, MD  predniSONE (DELTASONE) 20 MG tablet 3 tabs poqday 1-2, 2 tabs poqday 3-4, 1 tab poqday 5-6 Patient not taking: Reported on 08/31/2023 09/18/22   Donita Brooks, MD     All other systems have been reviewed and were otherwise negative with the  exception of those mentioned in the HPI and as above.  Physical Exam: Vitals:   09/15/23 0850  BP: (!) 140/85  Pulse: 79  Resp: 18  Temp: 98.9 F (37.2 C)  SpO2: 98%    Body mass index is 29.16 kg/m.  General: Alert, no acute distress Cardiovascular: No pedal edema Respiratory: No cyanosis, no use of accessory musculature Skin: No lesions in the area of chief complaint Neurologic: Sensation intact distally Psychiatric: Patient is competent for consent with normal mood and affect Lymphatic: No axillary or cervical lymphadenopathy   Assessment/Plan: LUMBAR RADICULOPATHY Plan for Procedure(s): LEFT-SIDED MICRODISCECTOMY, L3-4   Jackelyn Hoehn, MD 09/15/2023 11:18 AM

## 2023-09-15 NOTE — Op Note (Addendum)
 PATIENT NAME: Trevor Chavez   MEDICAL RECORD NO.:   [id number]   DATE OF BIRTH: 1975-11-18   DATE OF PROCEDURE: 09/15/2023                                 OPERATIVE REPORT     PREOPERATIVE DIAGNOSES: 1. Left-sided L3 and L4 radiculopathy. 2. 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 Left-sided L3 and L4 radiculopathy. 2. 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   SURGEON:  Virl Grimes, MD.   ASSISTANTGeraline Knapp, PA-C.   ANESTHESIA:  General endotracheal anesthesia.   COMPLICATIONS:  None.   DISPOSITION:  Stable.   ESTIMATED BLOOD LOSS:  Minimal.   INDICATIONS FOR SURGERY:  Briefly, Trevor Chavez is a pleasant 48 year old male, who did present to me with severe pain in the left leg.  The patient's MRI did reveal the findings outlined above.  He did continue to have significant pain, despite appropriate nonsurgical treatment measures.  Given his MRI findings, and ongoing pain, we did ultimately elect to proceed with the procedure reflected above.  The patient was fully made aware of the risks of surgery, including the risk of recurrent herniation and the need for subsequent surgery, including the possibility of a subsequent diskectomy and/or fusion.   OPERATIVE DETAILS:  On 09/15/2023, the patient was brought to surgery and general endotracheal anesthesia was administered.  The patient was placed prone on a well-padded flat Jackson bed with a spinal frame.  Antibiotics were given.  The back was prepped and draped and a time-out procedure was performed.  At this point, a midline incision was made directly over the L3/4 intervertebral space.  A curvilinear incision was made just to the left of the midline into the fascia.  A self-retaining  McCulloch retractor was placed.  The lamina of L3 and L4 was identified and subperiosteally exposed.  I did use a high-speed bur to remove the inferior and medial aspect of the lamina at L3, and the superior aspect of the lamina of L4.  At this point, I removed the lateral aspect of the L3/4 ligamentum flavum.  Readily identified was the traversing left L4 nerve, which was noted to be under obvious tension.  I then continued the dissection more cephalad, to the level of the left L3 pedicle.  The exiting left L3 nerve was then identified.  Working in the axilla of the exiting left L3 nerve, and the lateral to the traversing left L4 nerve, using gentle retraction, abundant herniated disc material was identified.  This was released using an Epstein curette, and multiple fragments were removed, thereby decompressing the left L3 and L4 nerves. I was very pleased with the final decompression that I was able to accomplish.  At this point, the wound was copiously irrigated with normal saline.  All epidural bleeding was controlled using bipolar electrocautery in addition to Surgiflo. All bleeding was controlled at the termination of the procedure.  At this point, 30 mg of Depo-Medrol  was introduced about the epidural space in the region of the left L3 and L4 nerves.  The wound was then closed in layers using #1 Vicryl followed by 2-0 Vicryl, followed by 4-0 Monocryl. Benzoin and Steri-Strips were applied followed by a sterile  dressing. All instrument counts were correct at the termination of the procedure.   Of note, Geraline Knapp was my assistant throughout surgery, and did aid in retraction, suctioning, and closure from start to finish.     Virl Grimes, MD

## 2023-09-15 NOTE — Anesthesia Postprocedure Evaluation (Signed)
 Anesthesia Post Note  Patient: Trevor Chavez  Procedure(s) Performed: LUMBAR LAMINECTOMY/DECOMPRESSION MICRODISCECTOMY 1 LEVEL (Left)     Patient location during evaluation: PACU Anesthesia Type: General Level of consciousness: awake and alert Pain management: pain level controlled Vital Signs Assessment: post-procedure vital signs reviewed and stable Respiratory status: spontaneous breathing, nonlabored ventilation, respiratory function stable and patient connected to nasal cannula oxygen Cardiovascular status: blood pressure returned to baseline and stable Postop Assessment: no apparent nausea or vomiting Anesthetic complications: no   There were no known notable events for this encounter.  Last Vitals:  Vitals:   09/15/23 1445 09/15/23 1500  BP: 113/67   Pulse: 76 75  Resp: 12 10  Temp:    SpO2: 95% 97%    Last Pain:  Vitals:   09/15/23 1500  TempSrc:   PainSc: 3     LLE Motor Response: Purposeful movement (09/15/23 1500) LLE Sensation: Full sensation (09/15/23 1500) RLE Motor Response: Purposeful movement (09/15/23 1500) RLE Sensation: Full sensation (09/15/23 1500)      Deshonna Trnka

## 2023-09-16 ENCOUNTER — Encounter (HOSPITAL_COMMUNITY): Payer: Self-pay | Admitting: Orthopedic Surgery
